# Patient Record
Sex: Male | Born: 1935 | Race: White | Hispanic: No | Marital: Married | Smoking: Current every day smoker
Health system: Southern US, Community
[De-identification: ages and names within clinical notes are randomized; demographics above are authoritative.]

## PROBLEM LIST (undated history)

## (undated) DIAGNOSIS — I10 Essential (primary) hypertension: Secondary | ICD-10-CM

## (undated) DIAGNOSIS — R001 Bradycardia, unspecified: Secondary | ICD-10-CM

## (undated) DIAGNOSIS — Z8669 Personal history of other diseases of the nervous system and sense organs: Secondary | ICD-10-CM

## (undated) DIAGNOSIS — I619 Nontraumatic intracerebral hemorrhage, unspecified: Secondary | ICD-10-CM

## (undated) DIAGNOSIS — F0390 Unspecified dementia without behavioral disturbance: Secondary | ICD-10-CM

## (undated) DIAGNOSIS — R55 Syncope and collapse: Secondary | ICD-10-CM

## (undated) DIAGNOSIS — I671 Cerebral aneurysm, nonruptured: Secondary | ICD-10-CM

## (undated) HISTORY — DX: Syncope and collapse: R55

## (undated) HISTORY — DX: Personal history of other diseases of the nervous system and sense organs: Z86.69

## (undated) HISTORY — DX: Cerebral aneurysm, nonruptured: I67.1

## (undated) HISTORY — PX: CEREBRAL ANEURYSM REPAIR: SHX164

## (undated) HISTORY — PX: COLONOSCOPY: SHX174

## (undated) HISTORY — PX: VENTRICULOPERITONEAL SHUNT: SHX204

## (undated) HISTORY — DX: Essential (primary) hypertension: I10

## (undated) HISTORY — DX: Nontraumatic intracerebral hemorrhage, unspecified: I61.9

## (undated) HISTORY — DX: Bradycardia, unspecified: R00.1

---

## 1997-06-07 ENCOUNTER — Ambulatory Visit (HOSPITAL_COMMUNITY): Admission: RE | Admit: 1997-06-07 | Discharge: 1997-06-07 | Payer: Self-pay | Admitting: *Deleted

## 1998-07-07 ENCOUNTER — Ambulatory Visit (HOSPITAL_COMMUNITY): Admission: RE | Admit: 1998-07-07 | Discharge: 1998-07-07 | Payer: Self-pay | Admitting: *Deleted

## 1999-09-20 ENCOUNTER — Emergency Department (HOSPITAL_COMMUNITY): Admission: EM | Admit: 1999-09-20 | Discharge: 1999-09-20 | Payer: Self-pay | Admitting: Emergency Medicine

## 1999-11-09 ENCOUNTER — Ambulatory Visit (HOSPITAL_COMMUNITY): Admission: RE | Admit: 1999-11-09 | Discharge: 1999-11-09 | Payer: Self-pay | Admitting: *Deleted

## 2000-09-17 ENCOUNTER — Encounter: Payer: Self-pay | Admitting: Emergency Medicine

## 2000-09-17 ENCOUNTER — Emergency Department (HOSPITAL_COMMUNITY): Admission: EM | Admit: 2000-09-17 | Discharge: 2000-09-17 | Payer: Self-pay | Admitting: Emergency Medicine

## 2000-11-16 ENCOUNTER — Ambulatory Visit (HOSPITAL_COMMUNITY): Admission: RE | Admit: 2000-11-16 | Discharge: 2000-11-16 | Payer: Self-pay | Admitting: Internal Medicine

## 2000-11-16 ENCOUNTER — Encounter: Payer: Self-pay | Admitting: Internal Medicine

## 2001-01-12 ENCOUNTER — Ambulatory Visit (HOSPITAL_COMMUNITY): Admission: RE | Admit: 2001-01-12 | Discharge: 2001-01-12 | Payer: Self-pay | Admitting: Gastroenterology

## 2001-01-12 ENCOUNTER — Encounter (INDEPENDENT_AMBULATORY_CARE_PROVIDER_SITE_OTHER): Payer: Self-pay | Admitting: Specialist

## 2001-12-19 ENCOUNTER — Encounter: Payer: Self-pay | Admitting: Internal Medicine

## 2001-12-19 ENCOUNTER — Ambulatory Visit (HOSPITAL_COMMUNITY): Admission: RE | Admit: 2001-12-19 | Discharge: 2001-12-19 | Payer: Self-pay | Admitting: Internal Medicine

## 2002-02-02 ENCOUNTER — Emergency Department (HOSPITAL_COMMUNITY): Admission: EM | Admit: 2002-02-02 | Discharge: 2002-02-03 | Payer: Self-pay | Admitting: Emergency Medicine

## 2002-12-04 ENCOUNTER — Emergency Department (HOSPITAL_COMMUNITY): Admission: AD | Admit: 2002-12-04 | Discharge: 2002-12-04 | Payer: Self-pay | Admitting: Emergency Medicine

## 2003-01-17 ENCOUNTER — Ambulatory Visit (HOSPITAL_COMMUNITY): Admission: RE | Admit: 2003-01-17 | Discharge: 2003-01-17 | Payer: Self-pay | Admitting: Cardiology

## 2003-09-15 ENCOUNTER — Inpatient Hospital Stay (HOSPITAL_COMMUNITY): Admission: EM | Admit: 2003-09-15 | Discharge: 2003-09-17 | Payer: Self-pay | Admitting: Emergency Medicine

## 2005-04-05 ENCOUNTER — Ambulatory Visit (HOSPITAL_COMMUNITY): Admission: RE | Admit: 2005-04-05 | Discharge: 2005-04-05 | Payer: Self-pay | Admitting: Internal Medicine

## 2005-05-10 ENCOUNTER — Ambulatory Visit: Payer: Self-pay | Admitting: Internal Medicine

## 2005-05-26 ENCOUNTER — Encounter (INDEPENDENT_AMBULATORY_CARE_PROVIDER_SITE_OTHER): Payer: Self-pay | Admitting: Specialist

## 2005-05-26 ENCOUNTER — Ambulatory Visit (HOSPITAL_COMMUNITY): Admission: RE | Admit: 2005-05-26 | Discharge: 2005-05-26 | Payer: Self-pay | Admitting: Gastroenterology

## 2006-04-13 ENCOUNTER — Ambulatory Visit (HOSPITAL_COMMUNITY): Admission: RE | Admit: 2006-04-13 | Discharge: 2006-04-13 | Payer: Self-pay | Admitting: Internal Medicine

## 2006-05-13 ENCOUNTER — Ambulatory Visit: Payer: Self-pay | Admitting: Internal Medicine

## 2006-12-23 ENCOUNTER — Ambulatory Visit: Payer: Self-pay | Admitting: Cardiology

## 2006-12-23 ENCOUNTER — Emergency Department (HOSPITAL_COMMUNITY): Admission: EM | Admit: 2006-12-23 | Discharge: 2006-12-23 | Payer: Self-pay | Admitting: Emergency Medicine

## 2007-01-03 ENCOUNTER — Ambulatory Visit: Payer: Self-pay | Admitting: Internal Medicine

## 2007-01-03 ENCOUNTER — Ambulatory Visit: Payer: Self-pay

## 2007-01-03 ENCOUNTER — Encounter: Payer: Self-pay | Admitting: Internal Medicine

## 2007-01-03 LAB — CONVERTED CEMR LAB
BUN: 7 mg/dL (ref 6–23)
CO2: 28 meq/L (ref 19–32)
Calcium: 8.2 mg/dL — ABNORMAL LOW (ref 8.4–10.5)
Chloride: 95 meq/L — ABNORMAL LOW (ref 96–112)
Creatinine, Ser: 0.8 mg/dL (ref 0.4–1.5)
GFR calc Af Amer: 123 mL/min
GFR calc non Af Amer: 102 mL/min
Glucose, Bld: 117 mg/dL — ABNORMAL HIGH (ref 70–99)
Potassium: 3.6 meq/L (ref 3.5–5.1)
Sodium: 130 meq/L — ABNORMAL LOW (ref 135–145)

## 2007-02-21 ENCOUNTER — Ambulatory Visit: Payer: Self-pay | Admitting: Internal Medicine

## 2007-02-24 ENCOUNTER — Emergency Department (HOSPITAL_COMMUNITY): Admission: EM | Admit: 2007-02-24 | Discharge: 2007-02-24 | Payer: Self-pay | Admitting: Emergency Medicine

## 2007-05-02 ENCOUNTER — Ambulatory Visit (HOSPITAL_COMMUNITY): Admission: RE | Admit: 2007-05-02 | Discharge: 2007-05-02 | Payer: Self-pay | Admitting: Internal Medicine

## 2007-05-22 ENCOUNTER — Ambulatory Visit: Payer: Self-pay | Admitting: Internal Medicine

## 2008-04-17 DIAGNOSIS — I10 Essential (primary) hypertension: Secondary | ICD-10-CM | POA: Insufficient documentation

## 2008-04-17 DIAGNOSIS — Z8669 Personal history of other diseases of the nervous system and sense organs: Secondary | ICD-10-CM

## 2008-04-17 DIAGNOSIS — R55 Syncope and collapse: Secondary | ICD-10-CM

## 2008-04-17 DIAGNOSIS — I619 Nontraumatic intracerebral hemorrhage, unspecified: Secondary | ICD-10-CM | POA: Insufficient documentation

## 2008-04-17 DIAGNOSIS — I671 Cerebral aneurysm, nonruptured: Secondary | ICD-10-CM

## 2008-04-17 DIAGNOSIS — R001 Bradycardia, unspecified: Secondary | ICD-10-CM

## 2008-04-18 ENCOUNTER — Ambulatory Visit: Payer: Self-pay | Admitting: Internal Medicine

## 2008-04-18 ENCOUNTER — Encounter: Payer: Self-pay | Admitting: Internal Medicine

## 2008-05-17 ENCOUNTER — Ambulatory Visit (HOSPITAL_COMMUNITY): Admission: RE | Admit: 2008-05-17 | Discharge: 2008-05-17 | Payer: Self-pay | Admitting: Internal Medicine

## 2009-05-29 ENCOUNTER — Ambulatory Visit: Payer: Self-pay | Admitting: Internal Medicine

## 2009-12-31 ENCOUNTER — Encounter
Admission: RE | Admit: 2009-12-31 | Discharge: 2009-12-31 | Payer: Self-pay | Source: Home / Self Care | Attending: Diagnostic Neuroimaging | Admitting: Diagnostic Neuroimaging

## 2010-01-15 ENCOUNTER — Encounter: Payer: Self-pay | Admitting: Internal Medicine

## 2010-01-15 ENCOUNTER — Ambulatory Visit
Admission: RE | Admit: 2010-01-15 | Discharge: 2010-01-15 | Payer: Self-pay | Source: Home / Self Care | Attending: Internal Medicine | Admitting: Internal Medicine

## 2010-02-05 NOTE — Assessment & Plan Note (Signed)
Summary: 1 yr fu   Visit Type:  Follow-up Primary Provider:  Marisue Brooklyn, MD   History of Present Illness: Ian Sellers returns today for followup.  He is a pleasant 76 yo man with a h/o defecation syncope.  He denies constipation recently.  He has had one or two episodes of syncope since I last saw him a year ago.  He did not hurt himself.  He has not had syncope at other times.  He has continued to have HTN.  Current Medications (verified): 1)  Colace 100 Mg Caps (Docusate Sodium) .... As Needed 2)  Enalapril Maleate 20 Mg Tabs (Enalapril Maleate) .... 2 By Mouth Once Daily 3)  Furosemide 20 Mg Tabs (Furosemide) .... Take One Tablet By Mouth Daily. 4)  Amlodipine Besylate 10 Mg Tabs (Amlodipine Besylate) .... Take One Tablet By Mouth Daily 5)  Clonazepam 1 Mg Tabs (Clonazepam) .... As Needed 6)  Doxazosin Mesylate 4 Mg Tabs (Doxazosin Mesylate) .Marland Kitchen.. 1 By Mouth Once Daily 7)  Carbatrol 300 Mg Xr12h-Cap (Carbamazepine) .... 2 By Mouth Two Times A Soth 8)  Calcium 500 + D 500-125 Mg-Unit Tabs (Calcium Carbonate-Vitamin D) .Marland Kitchen.. 1 By Mouth Once Daily 9)  Multivitamins   Tabs (Multiple Vitamin) .... Take One Tablet By Mouth Once Daily. 10)  Vitamin D3 1000 Unit Tabs (Cholecalciferol) .... Two Times A Rosero  Allergies (verified): 1)  ! Sulfa  Past History:  Past Medical History: Last updated: 04/17/2008 INTRACEREBRAL HEMORRHAGE (ICD-431) BRADYCARDIA (ICD-427.89) SEIZURES, HX OF (ICD-V12.49) SYNCOPE (ICD-780.2) CEREBRAL ANEURYSM (ICD-437.3) HYPERTENSION (ICD-401.9)  Past Surgical History: Last updated: 04/17/2008 Colonoscopy with hot biopsy.  Colonoscopy with polypectomy. Cerebral aneurysm, status post surgery.   Ventriculoperitoneal shunt.  Review of Systems       The patient complains of syncope.  The patient denies chest pain, dyspnea on exertion, and peripheral edema.    Vital Signs:  Patient profile:   76 year old male Height:      67 inches Weight:      145 pounds BMI:      22.79 Pulse rate:   60 / minute BP sitting:   158 / 60  (left arm)  Vitals Entered By: Laurance Flatten CMA (May 29, 2009 3:46 PM)  Physical Exam  General:  Well developed, well nourished, in no acute distress. Head:  normocephalic and atraumatic Eyes:  PERRLA/EOM intact; conjunctiva and lids normal. Mouth:  Teeth, gums and palate normal. Oral mucosa normal. Neck:  Neck supple, no JVD. No masses, thyromegaly or abnormal cervical nodes. Lungs:  Clear bilaterally to auscultation with no wheezes, rales, or rhonchi. Heart:  RRR with normal S1 and a split S2.  PMI is enlarged and laterally displaced. Abdomen:  Bowel sounds positive; abdomen soft and non-tender without masses, organomegaly, or hernias noted. No hepatosplenomegaly. Msk:  Back normal, normal gait. Muscle strength and tone normal. Pulses:  pulses normal in all 4 extremities Extremities:  No clubbing or cyanosis. Neurologic:  Alert and oriented x 3.   EKG  Procedure date:  05/29/2009  Findings:      Normal sinus rhythm with rate of:  60.  Impression & Recommendations:  Problem # 1:  SYNCOPE (ICD-780.2) His symptoms have been for the most part well controlled.  He will continue his current meds.  I have recommended he increase the fiber in his diet.  He will continue his colace. His updated medication list for this problem includes:    Enalapril Maleate 20 Mg Tabs (Enalapril maleate) .Marland Kitchen... 2 by  mouth once daily    Amlodipine Besylate 10 Mg Tabs (Amlodipine besylate) .Marland Kitchen... Take one tablet by mouth daily  Problem # 2:  HYPERTENSION (ICD-401.9) His blood pressure is elevated today.  However, with his h/o syncope, I have recommended that we continue his current meds and allow him to live with a bit higher pressure in hopes of preventing his syncope. The following medications were removed from the medication list:    Cardura 8 Mg Tabs (Doxazosin mesylate) .Marland Kitchen... 1 by mouth once daily His updated medication list for this problem  includes:    Enalapril Maleate 20 Mg Tabs (Enalapril maleate) .Marland Kitchen... 2 by mouth once daily    Furosemide 20 Mg Tabs (Furosemide) .Marland Kitchen... Take one tablet by mouth daily.    Amlodipine Besylate 10 Mg Tabs (Amlodipine besylate) .Marland Kitchen... Take one tablet by mouth daily    Doxazosin Mesylate 4 Mg Tabs (Doxazosin mesylate) .Marland Kitchen... 1 by mouth once daily  Patient Instructions: 1)  Your physician recommends that you schedule a follow-up appointment in: 12 months with Dr Ladona Ridgel

## 2010-02-05 NOTE — Assessment & Plan Note (Signed)
Summary: yearly/sl   Visit Type:  Follow-up Primary Provider:  Marisue Brooklyn, MD   History of Present Illness: Ian Sellers returns today for followup.  He has a h/o autonomic dysfunction and defecation syncope. I last saw him several months ago and he was doing well with only rare episodes of recurrent symptoms while on colace and a high fiber diet. His wife notes that his bowels are a bit loose but no diarrhea. He denies c/p, sob, or peripheral edema. He fell once while walking. He does not check his blood pressure at home.  Current Medications (verified): 1)  Colace 100 Mg Caps (Docusate Sodium) .... As Needed 2)  Enalapril Maleate 20 Mg Tabs (Enalapril Maleate) .... 2 By Mouth Once Daily 3)  Amlodipine Besylate 10 Mg Tabs (Amlodipine Besylate) .... Take One Tablet By Mouth Daily 4)  Clonazepam 1 Mg Tabs (Clonazepam) .... As Needed 5)  Doxazosin Mesylate 4 Mg Tabs (Doxazosin Mesylate) .Marland Kitchen.. 1 By Mouth Once Daily 6)  Carbatrol 300 Mg Xr12h-Cap (Carbamazepine) .... 2 By Mouth Two Times A Schnee 7)  Calcium 500 + D 500-125 Mg-Unit Tabs (Calcium Carbonate-Vitamin D) .Marland Kitchen.. 1 By Mouth Once Daily 8)  Multivitamins   Tabs (Multiple Vitamin) .... Take One Tablet By Mouth Once Daily. 9)  Vitamin D3 1000 Unit Tabs (Cholecalciferol) .... 6 Tads Daily  Allergies: 1)  ! Sulfa  Past History:  Past Medical History: Last updated: 04/17/2008 INTRACEREBRAL HEMORRHAGE (ICD-431) BRADYCARDIA (ICD-427.89) SEIZURES, HX OF (ICD-V12.49) SYNCOPE (ICD-780.2) CEREBRAL ANEURYSM (ICD-437.3) HYPERTENSION (ICD-401.9)  Past Surgical History: Last updated: 04/17/2008 Colonoscopy with hot biopsy.  Colonoscopy with polypectomy. Cerebral aneurysm, status post surgery.   Ventriculoperitoneal shunt.  Vital Signs:  Patient profile:   76 year old male Height:      67 inches Weight:      156 pounds BMI:     24.52 Pulse rate:   72 / minute BP sitting:   182 / 72  (left arm)  Vitals Entered By: Ian Sellers CMA  (January 15, 2010 9:19 AM)  Physical Exam  General:  Well developed, well nourished, in no acute distress. Head:  normocephalic and atraumatic Eyes:  PERRLA/EOM intact; conjunctiva and lids normal. Neck:  Neck supple, no JVD. No masses, thyromegaly or abnormal cervical nodes. Lungs:  Clear bilaterally to auscultation with no wheezes, rales, or rhonchi. Heart:  RRR with normal S1 and a split S2.  PMI is enlarged and laterally displaced. Abdomen:  Bowel sounds positive; abdomen soft and non-tender without masses, organomegaly, or hernias noted. No hepatosplenomegaly. Msk:  Back normal, normal gait. Muscle strength and tone normal. Pulses:  pulses normal in all 4 extremities Extremities:  No clubbing or cyanosis. Neurologic:  Alert and oriented x 3.   EKG  Procedure date:  01/15/2010  Findings:      Normal sinus rhythm with rate of:  First degree AV-Block noted.  Left axis deviation.    Impression & Recommendations:  Problem # 1:  SYNCOPE (ICD-780.2) His symptoms are quiet. Will follow. His updated medication list for this problem includes:    Enalapril Maleate 20 Mg Tabs (Enalapril maleate) .Marland Kitchen... 2 by mouth once daily    Amlodipine Besylate 10 Mg Tabs (Amlodipine besylate) .Marland Kitchen... Take one tablet by mouth daily  Problem # 2:  HYPERTENSION (ICD-401.9) His blood pressure is high. He is not checking his blood pressure at home.  His wife states that it is typically lower. Will ask him to keep a log and let us know if his  pressure remains high. The following medications were removed from the medication list:    Furosemide 20 Mg Tabs (Furosemide) .Marland Kitchen... Take one tablet by mouth daily. His updated medication list for this problem includes:    Enalapril Maleate 20 Mg Tabs (Enalapril maleate) .Marland Kitchen... 2 by mouth once daily    Amlodipine Besylate 10 Mg Tabs (Amlodipine besylate) .Marland Kitchen... Take one tablet by mouth daily    Doxazosin Mesylate 4 Mg Tabs (Doxazosin mesylate) .Marland Kitchen... 1 by mouth once  daily  Other Orders: EKG w/ Interpretation (93000)  Patient Instructions: 1)  Your physician recommends that you continue on your current medications as directed. Please refer to the Current Medication list given to you today. 2)  Your physician wants you to follow-up in: 12 months with Dr Court Joy will receive a reminder letter in the mail two months in advance. If you don't receive a letter, please call our office to schedule the follow-up appointment.

## 2010-04-01 ENCOUNTER — Other Ambulatory Visit (HOSPITAL_COMMUNITY): Payer: Self-pay | Admitting: Internal Medicine

## 2010-04-01 ENCOUNTER — Ambulatory Visit (HOSPITAL_COMMUNITY)
Admission: RE | Admit: 2010-04-01 | Discharge: 2010-04-01 | Disposition: A | Discharge status: Home / Self Care | Payer: Medicare Other | Source: Ambulatory Visit | Attending: Internal Medicine | Admitting: Internal Medicine

## 2010-04-01 DIAGNOSIS — M25519 Pain in unspecified shoulder: Secondary | ICD-10-CM

## 2010-04-01 DIAGNOSIS — R079 Chest pain, unspecified: Secondary | ICD-10-CM | POA: Insufficient documentation

## 2010-04-01 DIAGNOSIS — I10 Essential (primary) hypertension: Secondary | ICD-10-CM

## 2010-04-01 DIAGNOSIS — F17200 Nicotine dependence, unspecified, uncomplicated: Secondary | ICD-10-CM | POA: Insufficient documentation

## 2010-04-01 DIAGNOSIS — M19019 Primary osteoarthritis, unspecified shoulder: Secondary | ICD-10-CM | POA: Insufficient documentation

## 2010-04-01 DIAGNOSIS — R05 Cough: Secondary | ICD-10-CM | POA: Insufficient documentation

## 2010-04-01 DIAGNOSIS — R059 Cough, unspecified: Secondary | ICD-10-CM | POA: Insufficient documentation

## 2010-05-19 NOTE — Assessment & Plan Note (Signed)
[facility]                         ELECTROPHYSIOLOGY OFFICE NOTE   ARRICK, DUTTON                          MRN:          [id number]  DATE:05/22/2007                            DOB:          [birth year]    Mr. Ian Sellers returns today for follow-up.  He is a very pleasant male with  neurally mediated syncope, mostly micturition and defecation syncope,  who returns today for follow-up.  He notes that he has had some fatigue  and weakness and shortness of breath over the last several months and  one frank episode of syncope associated with loss of bowel and bladder  continence.  The patient notes that prior to his episode of passing out  that he did not feel the urge to urinate or defecate but apparently did  so after he passed out.  He was quite diaphoretic afterwards.  He  eventually felt better.  The patient also suffers with hypertension  which has been difficult control.   MEDICATIONS:  Include:  1. Enalapril 40 a [place].  2. Doxazosin 8 mg half tablet twice daily.  3. Clonazepam 0.5 every night.  4. Norvasc 10 a [place]ister.  5. Furosemide 20 a Delancy p.r.n.  6. Colace.   On physical exam, he is a pleasant elderly man in no acute distress.  Blood pressure today was 162/82, pulse 60 and regular, respirations were  18.  Weight was 151 pounds.  NECK:  Revealed no jugular venous distention.  No thyromegaly.  LUNGS:  Clear bilaterally to auscultation.  No wheezes, rales or rhonchi  were present.  CARDIOVASCULAR EXAM:  Revealed a regular rhythm, normal  S1-S2.  There were no murmurs, rubs, or gallops that I can appreciate.  ABDOMINAL EXAM:  Soft, nontender, nondistended.  There was no  organomegaly.  EXTREMITIES:  Demonstrated no cyanosis, clubbing or edema.   EKG demonstrates sinus bradycardia with questionable septal infarct.   IMPRESSION:  1. Recurrent episodes of syncope.  2. Hypertension.   DISCUSSION:  The patient has a difficult combination of symptoms.   He  has an indication for implantable loop recorder.  I suspect he is having  a component bradycardia with these episodes, but I have never been able  to document.  As per our last visit, I have recommended proceeding with  loop recorder implantation to further assess his episodes of syncope.  He is considering his options and will call us if he would like to  schedule loop recorder insertion.    Doylene Canning. Ladona Ridgel, MD  Electronically Signed   GWT/MedQ  DD: 05/22/2007  DT: 05/22/2007  Job #: [id number]

## 2010-05-19 NOTE — Consult Note (Signed)
NAME:  Ian Sellers, Ian Sellers                   ACCOUNT NO.:  [id number]   MEDICAL RECORD NO.:  [id number]          PATIENT TYPE:  EMS   LOCATION:  MAJO                         FACILITY:  [facility]   PHYSICIAN:  Everardo Beals. Juanda Chance, MD, FACCDATE OF BIRTH:  1934-06-26   DATE OF CONSULTATION:  12/23/2006  DATE OF DISCHARGE:                                 CONSULTATION   PRIMARY CARE PHYSICIAN:  Dr. Marisue Brooklyn.   CARDIOLOGIST:  Dr. Willa Rough and Dr. Lewayne Bunting.   CHIEF COMPLAINT:  Blackout spell.   CLINICAL HISTORY:  Mr. Mroczka is 76 years old and has a history of prior  syncopal spells.  Today while he was on the commode and straining, he  developed diaphoresis and then blacked out.  He did not fall to the  floor but slumped on the commode and apparently completely passed out.  His wife saw him and he had some noisy breathing and then responded.  She called 911 and EMS brought him to the emergency room.  He had no  associated shortness of breath, chest pain or palpitations.   He has had a number of similar episodes in the past and has been  followed by Dr. Ladona Ridgel for this.  The last episode he had was about 3 or  4 months ago.  He had normal LV function by echo 4 years ago.  We have  not been able to find any record of any stress test.  He and his family  says he has had an event monitor and bradycardia sometime in the past.   PAST MEDICAL HISTORY:  Significant for hypertension and a history of  seizure disorder.  He also has a VP shunt presumably for some type of  hydrocephalus.   CURRENT MEDICATIONS:  1. Carbatrol 500 mg q.i.d.  2. Clonazepam 1 mg q.h.s.  3. Doxazosin 4 mg q.h.s.  4. Enalapril 20 mg daily.  5. Norvasc 10 mg daily.   SOCIAL HISTORY:  He lives with his wife.  He is a smoker.   FAMILY HISTORY:  Noncontributory.   REVIEW OF SYSTEMS:  Negative for chest pain, shortness of breath and  palpitations.  He does have some arthritic symptoms but review of  systems is otherwise  negative.   PHYSICAL EXAMINATION:  The blood pressure 159/78, pulse 58 and regular.  There is no vein distention. The carotid pulses were full without  bruits.  CHEST:  Clear without rales or rhonchi.  HEART:  Rhythm was regular. I could hear no murmurs, no gallops.  ABDOMEN:  Soft with normal bowel sounds.  There was no  hepatosplenomegaly. Peripheral pulses were full and there was no  peripheral edema.  MUSCULOSKELETAL:  Showed no deformities.  SKIN:  Warm and dry.  NEUROLOGIC:  showed no focal neurological signs.   Electrocardiogram showed sinus bradycardia with first degree AV block.  There was an intraventricular conduction delay and poor R wave  progression.   IMPRESSION:  1. Recurrent syncope probably neurocardiogenic syncope.  2. Hypertension.  3. History of seizure disorder.  4. Status post VP shunt presumably  for hydrocephalous.   RECOMMENDATIONS:  Will plan to check laboratory studies to make sure his  cardiac markers are negative and electrolytes are in balance and a CBC  is normal.  If these studies are all negative then I think he should be  able to go home today.  We will arrange an outpatient echocardiogram  since it has been some time since he had this done and also an  outpatient event monitor.  Will arrange follow up with Dr. Ladona Ridgel.      Bruce Elvera Lennox Juanda Chance, MD, [facility]  Electronically Signed     BRB/MEDQ  D:  12/23/2006  T:  12/24/2006  Job:  [id number]

## 2010-05-19 NOTE — Assessment & Plan Note (Signed)
 [facility]                         ELECTROPHYSIOLOGY OFFICE NOTE   Ian Sellers, Ian Sellers                          MRN:          [id number]  DATE:02/21/2007                            DOB:          02-07-1934    Ian Sellers returns today for follow-up.  He is a very pleasant 76 year old  male with a history of unexplained syncope, question defecation syncope,  who returns today for follow-up.  The patient notes he has had syncope  at least twice year for the last several years, never injured himself  severely.  He is here today for additional evaluation.   MEDICATIONS:  1. Enalapril 40 mg a Chakraborty.  2. Calcium supplements.  3. Carbatrol 300 __________ 2 tablets twice daily.  4. Doxazosin 8 mg half-tablet twice daily.  5. Norvasc 10 mg daily.  6. Multiple vitamins.   FAMILY HISTORY:  Noncontributory.   SOCIAL HISTORY:  The patient has tobacco abuse history, 1 pack a [place] for  30 years.  No alcohol abuse is noted.   EXAM:  He is a pleasant, well-appearing man in no distress.  Blood pressure today was 172/86, the pulse 63 and regular, the  respirations were 18.  Weight 152 pounds.  NECK:  No jugular distention.  LUNGS:  Clear bilaterally to auscultation.  No wheezes, rales or rhonchi  present.  CARDIOVASCULAR:  Regular rate and rhythm.  Normal S1 and S2.  There were  no murmurs or gallops.  The PMI was not enlarged or laterally displaced.  There was no increased work of breathing on the lung exam.  ABDOMEN:  Soft, nontender, distended, no organomegaly.  EXTREMITIES:  No cyanosis, clubbing or edema.  The pulses were 2+ and  symmetric.  NEUROLOGIC:  Alert and oriented x3, cranial nerves intact.   EKG demonstrates sinus bradycardia with first-degree AV block.   IMPRESSION:  1. Unexplained syncope.  2. Hypertension.   DISCUSSION:  I have recommended that the patient undergo insertion of  implantable loop recorder.  He is considering his options.  He will  call  us if he would like to proceed with this.     Doylene Canning. Ladona Ridgel, MD  Electronically Signed    GWT/MedQ  DD: 02/21/2007  DT: 02/22/2007  Job #: [id number]

## 2010-05-19 NOTE — Assessment & Plan Note (Signed)
[facility]                         ELECTROPHYSIOLOGY OFFICE NOTE   KAISER, BELLUOMINI                          MRN:          [id number]  DATE:05/13/2006                            DOB:          04-Jan-1935    Mr. Nelligan returns today for followup. He is a very pleasant man with a  history of syncope with multiple potential mechanisms. He has a history  of bradycardia on cardiac monitoring which was asymptomatic. He has a  history of micturition as well as defecation syncope and the patient  notes that in the last year he has had one episode. It occurred several  weeks ago when he was [place] the [place]. It had been hot  that Ellis, he had been outside most of it and became somewhat dehydration  and suddenly passed out. He was only out for a few seconds by his wife's  report and when he awoke he was somewhat diaphoretic and nauseated. He  was treated with fluids and was placed in the shade but did not  otherwise have to seek medical attention. He returns today for followup  and has no recurrent spells since then. He has otherwise been stable. He  denies chest pain or shortness of breath. He has very minimal peripheral  edema which is dependent and better after sleeping at night.   PHYSICAL EXAMINATION:  GENERAL:  He is a pleasant, 76 year old man in no  acute distress.  VITAL SIGNS:  The blood pressure today was 144/72, pulse was 63 and  regular, respirations were 18 and the weight was 151 pounds.  NECK:  Revealed no jugular venous distention.  LUNGS:  Clear bilaterally to auscultation. There were no wheezes, rales  or rhonchi.  CARDIOVASCULAR:  Regular rate and rhythm with normal S1 and S2.  EXTREMITIES:  Demonstrated no edema.   EKG demonstrated sinus rhythm with first degree AV block and nonspecific  intraventricular conduction delay.   IMPRESSION:  1. Recurrent syncope likely neurally mediated versus autonomic      dysfunction.  2.  History of sinus bradycardia.  3. History of encephalomalacia.   DISCUSSION:  Overall Mr. Kimoto is stable. He is tolerating all of his  medications very nicely. These include aspirin, enalapril, multivitamin,  doxazosin, clonazepam, Norvasc and p.r.n. Lasix. I will plan to see the  patient back in the office in one year. We talked about goals and  methods to preventing additional syncope. If he has recurrent syncope  then he is instructed to call us for additional evaluation.    Doylene Canning. Ladona Ridgel, MD  Electronically Signed   GWT/MedQ  DD: 05/13/2006  DT: 05/14/2006  Job #: [id number]   cc:   Lovenia Kim, D.O.

## 2010-05-22 NOTE — Op Note (Signed)
NAME:  Ian Sellers, Ian Sellers                   ACCOUNT NO.:  [id number]   MEDICAL RECORD NO.:  [id number]          PATIENT TYPE:  AMB   LOCATION:  ENDO                         FACILITY:  [facility]   PHYSICIAN:  Petra Kuba, M.D.    DATE OF BIRTH:  10-22-1934   DATE OF PROCEDURE:  05/26/2005  DATE OF DISCHARGE:                                 OPERATIVE REPORT   PROCEDURE:  Colonoscopy with hot biopsy.   INDICATIONS FOR PROCEDURE:  Patient with a history of colon polyps due for  repeat screening.  Consent was signed after risks, benefits, methods and  options were thoroughly discussed multiple times in the past.   MEDICINES USED:  Fentanyl 50, Versed 5.   PROCEDURE:  Rectal inspection was pertinent for small external hemorrhoids.  Digital exam was negative.  The video pediatric adjustable colonoscope was  inserted, easily advanced around the colon to the cecum.  This did not  require any abdominal pressure or any position changes. Other than left-  sided diverticula, no other abnormalities were seen.  The cecum was  identified by the appendiceal orifice and the ileocecal valve. The prep was  fairly adequate. With a moderate amount of washing and suctioning, adequate  visualization was obtained. On slow withdrawal through the colon, the cecum  and the ascending were normal. In the transverse, three tiny to small polyps  were seen and were all hot biopsied x1 or 2. The scope was further  withdrawn.  She did have some diverticula on the left side with some spasm  and wall edema and a few hyperplastic appearing sigmoid polyps were seen,  one was hot biopsied which was slightly larger than the others and a few  were cold biopsied and put in a second container. The scope was withdrawn  back to the rectum. Anorectal pull-through and retroflexion confirmed some  small hemorrhoids. The scope was straightened and readvanced a short ways up  the left side of the colon, air was suctioned, scope removed.  The  patient  tolerated the procedure well.  There was no obvious or immediate  complication.   ENDOSCOPIC DIAGNOSES:  1.  Internal and external small hemorrhoids.  2.  Left-sided diverticula, some spasm and edema.  3.  Multiple sigmoid hyperplastic appearing polyps, one hot biopsy, two cold      biopsied.  4.  Three tiny small transverse polyps hot biopsied and put in a separate      container.  5.  Otherwise within normal limits to the cecum.   PLAN:  Await pathology but if doing medically probably proceed with a repeat  check in 5 years. Happy to see back p.r.n. otherwise return care Dr.  Elisabeth Most for the customary health care screening and maintenance.           ______________________________  Petra Kuba, M.D.     MEM/MEDQ  D:  05/26/2005  T:  05/26/2005  Job:  [id number]

## 2010-05-22 NOTE — Procedures (Signed)
[facility]  Patient:    Ian Sellers, Ian Sellers Visit Number: [id number] MRN: [id number]          Service Type: END Location: ENDO Attending Physician:  Nelda Marseille Dictated by:   Petra Kuba, M.D. Proc. Date: 01/12/01 Admit Date:  01/12/2001   CC:         Marinus Maw, M.D.   Procedure Report  PROCEDURE:  Colonoscopy with polypectomy.  INDICATIONS FOR PROCEDURE:  A patient with guaiac positivity.  Consent was signed after risks, benefits, methods, and options were thoroughly discussed in the office.  MEDICINES USED:  Demerol 60, Versed 6.  DESCRIPTION OF PROCEDURE:  Rectal inspection was pertinent for external hemorrhoids. Digital exam was negative. The video colonoscope was inserted and easily advanced around the colon to the cecum. On insertion, some left sided diverticula with a mild amount of inflammation was seen but no other abnormalities. The cecum was identified by the appendiceal orifice and the ileocecal valve. In fact, the scope was inserted a short ways into the terminal ileum which was normal. Photo documentation was obtained. The scope was slowly withdrawn. The prep was adequate. There was some liquid stool that required washing and suctioning. The TI and cecum were normal. In the ascending, hepatic flexure, and proximal transverse, three tiny to small polyps were seen and were all hot biopsied x1 or 2 and put in the same container. No other polypoid lesions were seen as we slowly withdrew back to the rectum. The left sided diverticula and mild inflammation was confirmed but no signs of diverticulitis. Once back in the rectum, the scope was retroflexed pertinent for some internal hemorrhoids and a little bit of scope trauma. The area of trauma was washed and watched. There was no active bleeding seen and there was no underlying lesion. Anal rectal pull-through confirmed the hemorrhoids, ruled out any other lesions. The scope  was reinserted a short ways up the left side of the colon, air and water were suctioned, scope removed. The patient tolerated the procedure well and there was no obvious or immediate complication.  ENDOSCOPIC DIAGNOSIS:  1. Internal/external hemorrhoids.  2. Left sided diverticula with minimal inflammation.  3. Three transverse and ascending tiny to small polyps hot biopsied.  4. Otherwise within normal limits to the terminal ileum.  PLAN:  Await pathology to determine future colonic screening. Follow-up p.r.n. or in two to three months to recheck guaiac, ______ symptoms, possibly CBC and decide if any further workup plans are needed like a one time endoscopy or upper GI small bowel follow-through. Dictated by:   Petra Kuba, M.D. Attending Physician:  Nelda Marseille DD:  01/12/01 TD:  01/13/01 Job: [id number]

## 2010-05-22 NOTE — H&P (Signed)
NAME:  Ian Sellers, Ian Sellers                             ACCOUNT NO.:  [id number]   MEDICAL RECORD NO.:  [id number]                   PATIENT TYPE:  EMS   LOCATION:  MAJO                                 FACILITY:  [facility]   PHYSICIAN:  Mobolaji B. Corky Downs, M.D.            DATE OF BIRTH:  [birth year]   DATE OF ADMISSION:  09/15/2003  DATE OF DISCHARGE:                                HISTORY & PHYSICAL   PRIMARY CARE PHYSICIAN:  Dr. Elisabeth Most.   CHIEF COMPLAINT:  Passing out in church this afternoon.   HISTORY OF PRESENTING COMPLAINT:  Ian Sellers is a 76 year old white male with  significant history of seizures, recurrent syncope in the past, and cerebral  aneurysm, status post ventriculoperitoneal shunt. The patient was sitting  and praying in church this morning when he suddenly felt hot all over his  body. He became diaphoretic and subsequently passed out for about two  minutes. His wife was right beside him. She noted that his shirt was wet and  she tried to wake him up. He did come around after about two minutes and he  was a little bit confused thereafter, but he soon recognized around people  around him. He was subsequently brought to the emergency department by EMS.  There was no associated foaming from the mouth, tongue biting, or bleeding  from the mouth. The patient's wife denies any seizure activity. Prior to  sitting down, praying, the patient was standing up, singing.   He has had several episodes of syncope in the past and he has been followed  up by Dr. Myrtis Ser. All tests, according to the patient's wife have been  negative so far.   The patient is also under the care of Dr. Thad Ranger for his seizures. His  last seizure was several years ago and he is compliant with his medications.   REVIEW OF SYSTEMS:  Negative for chest pain, shortness of breath,  palpitations. No cough, nausea, vomiting, or abdominal pain. No diarrhea or  constipation. No history of melenic stools. No urinary  symptoms. No joint  pains. No headaches. No focal weakness.   PAST MEDICAL HISTORY:  1.  Hypertension.  2.  Cerebral aneurysm, status post surgery.  3.  Ventriculoperitoneal shunt.  4.  Recurrent syncope.  5.  Seizures.   ALLERGIES:  He is allergic to SULFA. He cannot recall what type of reaction  he got many years ago.   FAMILY HISTORY:  The patient is married. He lives with his wife.   SOCIAL HISTORY:  He denies alcohol use and he is independent of activities  of daily living.   PHYSICAL EXAMINATION:  VITAL SIGNS: On arrival in the emergency department  blood pressure 143/80, pulse rate 62, respiratory rate 20, saturation 99%,  temperature 97.8. __________ on arrival by EMS was 106.  GENERAL: On examination he is alert and oriented to time, place, and person.  Evidence  of venectomy. Otherwise, there is no neck stiffness.  HEENT: Pupils equal, round, and reactive to light. Extraocular movements  intact. No carotid bruits. No cervical lymphadenopathy. No thyromegaly.  Mucous membranes moist.  LUNGS: Clear clinically to auscultation.  CVS: S1 and S2 regular. No gallop or murmur.  ABDOMEN: Obese, soft, nontender. No palpable organomegaly.  GROIN: No abnormality.  MUSCULOSKELETAL: No bruise or injury.  SKIN: No rash or petechia.  CNS: Cranial nerves intact.  Muscle power 5/5 bilaterally. Deep tendon  reflexes 1+ in both biceps and 2+ in both knees. Plantar reflexes are  flexor. Muscle tone is normal and equal bilaterally.   EKG shows first-degree AV block, sinus bradycardia of 57, and  interventricular conduction defects in leads III, normal axis.   Initial laboratory data reveals sodium of 129, potassium 4.6, chloride 99,  BUN 10, glucose 112.  Hematocrit 36, hemoglobin 12.2, white cell count 9.4,  platelet count 265,000, creatinine 0.8.   ASSESSMENT/PLAN:  Ian Sellers is a 76 year old white male with history of  hypertension, seizures, and recurrent syncope, now presenting with  another  episode of passing out, which was preceded by some prodromal symptoms.  However, the patient did have urinary incontinence, but no seizure activity  and no foaming from the mouth or tongue biting.  1.  Syncope, probably vasovagal. We need to rule out seizures. Will admit to      telemetry. Get serial cardiac enzymes. Obtain orthostatic blood      pressure. This was done and the patient not found to be orthostatic.      Will do a 2-D echocardiogram if not done recently. Will contact Dr.      Henrietta Hoover office to obtain records. Seizure precautions.  Intravenous      fluid. Normal saline at 100 cc per hour.  EEG. Awaiting list of      medications that the patient was using at home. Wife does not recall the      name at this point.  2.  Hypertension, uncontrolled. Will restart oral medications and if      necessary will titrate medications to control blood pressure.  3.  History of seizures. Will continue with old medications and check levels      of this medication if possible whenever they are available. Hopefully      the wife will bring them in this evening.  4.  Hyponatremia. Will monitor.  5.  Will also obtain CT scan of the brain in view of the possible seizures.                                                Mobolaji B. Corky Downs, M.D.    MBB/MEDQ  D:  09/15/2003  T:  09/15/2003  Job:  [id number]   cc:   Lovenia Kim, D.O.  [address]  [place]  [place] [address]  Fax: [phone]

## 2010-05-22 NOTE — Discharge Summary (Signed)
NAME:  Ian Sellers, Ian Sellers                             ACCOUNT NO.:  [id number]   MEDICAL RECORD NO.:  [id number]                   PATIENT TYPE:  INP   LOCATION:  4735                                 FACILITY:  [facility]   PHYSICIAN:  Mobolaji B. Corky Downs, M.D.            DATE OF BIRTH:  10/21/1934   DATE OF ADMISSION:  09/15/2003  DATE OF DISCHARGE:  09/17/2003                                 DISCHARGE SUMMARY   FINAL DIAGNOSIS:  Vasovagal syncope.   SECONDARY DIAGNOSES:  1.  History of seizures.  2.  Hypertension.  3.  History of recurrent syncope.  4.  History of ventriculoperitoneal shunt.   CHIEF COMPLAINT:  Passing out in church on the afternoon of admission.   BRIEF HISTORY:  The patient is a 76 year old white male with history of  recurrent syncope.  He was evaluated in the past by Dr. Myrtis Ser and also has  history of seizures evaluated by Dr. Thad Ranger.  He presented with symptoms  suggestive of vasovagal syncope; however, was incontinent, but there was no  seizure activity.   Please refer to complete History and Physical for further details.  For  pertinent investigations:  1.  CT of the head was negative for acute findings, no evidence of      intracranial abnormality, and stable left frontal encephalomalacia post      craniectomy.  2.  Cardiac enzymes negative.  3.  Tegretol level 8.1, within normal range.   HOSPITAL COURSE:  #1.  SYNCOPE:  The patient was admitted to rule out MI and did rule out for  MI.  No abnormal cardiac findings on telemetry.  However, EKG did show  normal sinus rhythm with first degree A-V block and sinus bradycardia at 57  which was not different from old EKG obtained in the office.  I did receive  some reports from the cardiologist's office and noted that patient had a  Holter monitor in December 2004 which showed sinus node dysfunction with  longest pause of 3.1 seconds and also had TEE done January 2005 which was  unremarkable.  EKG from the office  showed normal sinus rhythm with first  degree A-V block.  I did discuss with Dr. Eden Emms, covering for Dr. Myrtis Ser, and  it was felt that patient was stable at that point and could be followed up  in the office which I agreed with.  The patient remained stable during  hospitalization, no more episodes of syncope, and there was no seizure  activity.  He was subsequently discharged home.   #2.  PALPITATIONS:  The patient's blood pressure was controlled during  hospitalization.  He had an episode of blood pressure of 57/78 and also  186/80, predominantly systolic hypertension, and patient's blood pressure  was optimized with addition of Norvasc to his medications.   #3.  SEIZURE:  No seizure activity during this hospitalization.  Was placed  on  seizure precautions.  The patient remained stable, and he was instructed  to follow up with Dr. Myrtis Ser in the office during the following week and also  to follow up with Dr. Elisabeth Most one to two weeks and to call for  appointment, also to follow up with Dr. Thad Ranger, his neurologist.   The patient was discharged on the following medications.  He was instructed  to continue with his home medications and additionally Norvasc 10 mg p.o.  daily.   The patient did well and was discharged home in stable condition.       MBB/MEDQ  D:  09/23/2003  T:  09/23/2003  Job:  [id number]   cc:   Lovenia Kim, D.O.  [address]  [place]  [place] [address]  Fax: [phone]   Willa Rough, M.D.   Michael L. Thad Ranger, M.D.  [address]  [address]  [place]  [place] [address]  Fax: [phone]

## 2010-05-22 NOTE — Procedures (Signed)
HISTORY:  This is a 76 year old patient who has had a history of  hypertension, seizures, recurrent syncope, history of cerebral aneurysm. The  patient was passing out while praying in church. The patient is being  evaluated for possible seizure event. This is a routine EEG recording. No  skull defects were noted, but the patient apparently has a shunt on the  right side of the head.   MEDICATIONS:  Enalapril, doxazosin, aspirin, Tegretol.   EEG CLASSIFICATION:  Normal awake.   DESCRIPTION:  According to the background rhythms, this recording consists  of a fairly well modulated medium amplitude 7 to 8 hertz background activity  that is reactive to eye opening and closure. As the record progresses, there  does appear to be a mild asymmetry in amplitude ____________ next to the  right side being a bit higher than the left. Background rhythm activity,  however, remained fairly symmetric. Photic stimulation is performed  resulting in a bilateral and symmetric photic driving response.  Hyperventilation was not performed. At no time during the recording did  there appear to be evidence of actual spike or spike wave discharges or  evidence of focal slowing. EKG monitor showed no evidence of cardiac rhythm  abnormalities with a heart rate of 72.   IMPRESSION:  This is a normal EEG recording in the awakened state. No  evidence of ictal or interictal discharges are seen.    Marlan Palau, M.D.   ZOX:WRUE  D:  09/16/2003 20:18:22  T:  09/17/2003 07:52:25  Job #:  [id number]

## 2010-05-22 NOTE — Procedures (Signed)
Audio too short to transcribe (less than 5 seconds)    C. Lesia Sago, M.D.   SAY:[id number]  D:  09/16/2003 20:15:43  T:  09/16/2003 20:16:56  Job #:  [id number]

## 2010-06-29 ENCOUNTER — Encounter: Payer: Self-pay | Admitting: Internal Medicine

## 2010-09-25 LAB — I-STAT 8, (EC8 V) (CONVERTED LAB)
Acid-base deficit: 1
BUN: 13
Bicarbonate: 24.9 — ABNORMAL HIGH
Chloride: 100
Glucose, Bld: 122 — ABNORMAL HIGH
HCT: 39
Hemoglobin: 13.3
Operator id: 133351
Potassium: 4.2
Sodium: 131 — ABNORMAL LOW
TCO2: 26
pCO2, Ven: 46.2
pH, Ven: 7.339 — ABNORMAL HIGH

## 2010-09-25 LAB — CBC
HCT: 38.6 — ABNORMAL LOW
Hemoglobin: 13.3
MCHC: 34.6
MCV: 100.3 — ABNORMAL HIGH
Platelets: 222
RBC: 3.85 — ABNORMAL LOW
RDW: 12.7
WBC: 7.9

## 2010-09-25 LAB — DIFFERENTIAL
Basophils Absolute: 0
Basophils Relative: 0
Eosinophils Absolute: 0.1
Eosinophils Relative: 1
Lymphocytes Relative: 13
Lymphs Abs: 1
Monocytes Absolute: 0.7
Monocytes Relative: 9
Neutro Abs: 6.1
Neutrophils Relative %: 77

## 2010-09-25 LAB — POCT I-STAT CREATININE
Creatinine, Ser: 0.9
Operator id: 133351

## 2010-10-09 LAB — COMPREHENSIVE METABOLIC PANEL
ALT: 15
AST: 18
Alkaline Phosphatase: 60
CO2: 23
Chloride: 98
GFR calc Af Amer: >60
GFR calc non Af Amer: >60
Glucose, Bld: 103 — ABNORMAL HIGH
Potassium: 3.3 — ABNORMAL LOW
Sodium: 131 — ABNORMAL LOW

## 2010-10-09 LAB — POCT CARDIAC MARKERS
CKMB, poc: <1 — ABNORMAL LOW
Myoglobin, poc: 56.5
Operator id: 288331
Troponin i, poc: <0.05

## 2010-10-09 LAB — POCT I-STAT CREATININE: Operator id: 288331

## 2010-10-09 LAB — DIFFERENTIAL
Basophils Absolute: 0
Basophils Relative: 0
Eosinophils Absolute: 0.1 — ABNORMAL LOW
Eosinophils Relative: 1
Lymphocytes Relative: 10 — ABNORMAL LOW
Lymphs Abs: 0.8
Monocytes Absolute: 0.7
Monocytes Relative: 9
Neutro Abs: 6.3
Neutrophils Relative %: 80 — ABNORMAL HIGH

## 2010-10-09 LAB — PROTIME-INR
INR: 1
Prothrombin Time: 13.7

## 2010-10-09 LAB — I-STAT 8, (EC8 V) (CONVERTED LAB)
Acid-base deficit: 3 — ABNORMAL HIGH
Chloride: 99
Hemoglobin: 12.9 — ABNORMAL LOW
Potassium: 3.9
Sodium: 129 — ABNORMAL LOW
pH, Ven: 7.379 — ABNORMAL HIGH

## 2010-10-09 LAB — CBC
HCT: 36.6 — ABNORMAL LOW
Hemoglobin: 12.7 — ABNORMAL LOW
MCHC: 34.8
MCV: 97.3
RBC: 3.76 — ABNORMAL LOW

## 2010-10-09 LAB — TROPONIN I: Troponin I: 0.02

## 2011-03-29 ENCOUNTER — Emergency Department (HOSPITAL_COMMUNITY): Payer: Medicare Other

## 2011-03-29 ENCOUNTER — Encounter (HOSPITAL_COMMUNITY): Payer: Self-pay | Admitting: Neurology

## 2011-03-29 ENCOUNTER — Emergency Department (HOSPITAL_COMMUNITY)
Admission: EM | Admit: 2011-03-29 | Discharge: 2011-03-29 | Disposition: A | Discharge status: Home / Self Care | Payer: Medicare Other | Attending: Emergency Medicine | Admitting: Emergency Medicine

## 2011-03-29 ENCOUNTER — Other Ambulatory Visit: Payer: Self-pay

## 2011-03-29 DIAGNOSIS — R040 Epistaxis: Secondary | ICD-10-CM | POA: Insufficient documentation

## 2011-03-29 DIAGNOSIS — R55 Syncope and collapse: Secondary | ICD-10-CM | POA: Insufficient documentation

## 2011-03-29 DIAGNOSIS — I10 Essential (primary) hypertension: Secondary | ICD-10-CM | POA: Insufficient documentation

## 2011-03-29 DIAGNOSIS — S0083XA Contusion of other part of head, initial encounter: Secondary | ICD-10-CM

## 2011-03-29 DIAGNOSIS — S1093XA Contusion of unspecified part of neck, initial encounter: Secondary | ICD-10-CM | POA: Insufficient documentation

## 2011-03-29 DIAGNOSIS — R799 Abnormal finding of blood chemistry, unspecified: Secondary | ICD-10-CM | POA: Insufficient documentation

## 2011-03-29 DIAGNOSIS — S0990XA Unspecified injury of head, initial encounter: Secondary | ICD-10-CM | POA: Insufficient documentation

## 2011-03-29 DIAGNOSIS — S0003XA Contusion of scalp, initial encounter: Secondary | ICD-10-CM | POA: Insufficient documentation

## 2011-03-29 DIAGNOSIS — R7889 Finding of other specified substances, not normally found in blood: Secondary | ICD-10-CM

## 2011-03-29 DIAGNOSIS — Z79899 Other long term (current) drug therapy: Secondary | ICD-10-CM | POA: Insufficient documentation

## 2011-03-29 DIAGNOSIS — R404 Transient alteration of awareness: Secondary | ICD-10-CM | POA: Insufficient documentation

## 2011-03-29 DIAGNOSIS — Z982 Presence of cerebrospinal fluid drainage device: Secondary | ICD-10-CM | POA: Insufficient documentation

## 2011-03-29 DIAGNOSIS — R296 Repeated falls: Secondary | ICD-10-CM | POA: Insufficient documentation

## 2011-03-29 LAB — CBC
HCT: 34.9 % — ABNORMAL LOW (ref 39.0–52.0)
MCHC: 36.4 g/dL — ABNORMAL HIGH (ref 30.0–36.0)
MCV: 94.3 fL (ref 78.0–100.0)
Platelets: 189 10*3/uL (ref 150–400)
RDW: 12.6 % (ref 11.5–15.5)
WBC: 7.6 10*3/uL (ref 4.0–10.5)

## 2011-03-29 LAB — URINALYSIS, ROUTINE W REFLEX MICROSCOPIC
Glucose, UA: NEGATIVE mg/dL
Hgb urine dipstick: NEGATIVE
Leukocytes, UA: NEGATIVE
Protein, ur: NEGATIVE mg/dL
pH: 6 (ref 5.0–8.0)

## 2011-03-29 LAB — COMPREHENSIVE METABOLIC PANEL
AST: 18 U/L (ref 0–37)
Albumin: 3.4 g/dL — ABNORMAL LOW (ref 3.5–5.2)
BUN: 19 mg/dL (ref 6–23)
Chloride: 102 mEq/L (ref 96–112)
Creatinine, Ser: 0.99 mg/dL (ref 0.50–1.35)
Potassium: 3.8 mEq/L (ref 3.5–5.1)
Total Bilirubin: 0.3 mg/dL (ref 0.3–1.2)
Total Protein: 5.3 g/dL — ABNORMAL LOW (ref 6.0–8.3)

## 2011-03-29 LAB — CARBAMAZEPINE LEVEL, TOTAL: Carbamazepine Lvl: 14.5 ug/mL — ABNORMAL HIGH (ref 4.0–12.0)

## 2011-03-29 MED ORDER — TETANUS-DIPHTH-ACELL PERTUSSIS 5-2.5-18.5 LF-MCG/0.5 IM SUSP
0.5000 mL | Freq: Once | INTRAMUSCULAR | Status: AC
  Administered 2011-03-29: 0.5 mL via INTRAMUSCULAR
  Filled 2011-03-29 (×2): qty 0.5

## 2011-03-29 NOTE — Discharge Instructions (Signed)
Icepack/cold to sore area. Take tylenol/advil as need.   Follow up with your doctor in the next 1-2 days for recheck. From todays labs, your tegretol level is mildly elevated (14.5) - call your doctor today and discuss this result, and inquire as to whether they want to decrease your dose, or change you back to your previous medication.  Your ct scans were read as showing no acute fracture or injury - note was made of degenerative changes/degenerative disc in the cervical region.   Return to ER if worse, symptoms recur, faint, chest discomfort, palpitations or fast heart beat, weak/faint, fevers, seizure, other concern.       Syncope You have had a fainting (syncopal) spell. A fainting episode is a sudden, short-lived loss of consciousness. It results in complete recovery. It occurs because there has been a temporary shortage of oxygen and/or sugar (glucose) to the brain. CAUSES   Blood pressure pills and other medications that may lower blood pressure below normal. Sudden changes in posture (sudden standing).   Over-medication. Take your medications as directed.   Standing too long. This can cause blood to pool in the legs.   Seizure disorders.   Low blood sugar (hypoglycemia) of diabetes. This more commonly causes coma.   Bearing down to go to the bathroom. This can cause your blood pressure to rise suddenly. Your body compensates by making the blood pressure too low when you stop bearing down.   Hardening of the arteries where the brain temporarily does not receive enough blood.   Irregular heart beat and circulatory problems.   Fear, emotional distress, injury, sight of blood, or illness.  Your caregiver will send you home if the syncope was from non-worrisome causes (benign). Depending on your age and health, you may stay to be monitored and observed. If you return home, have someone stay with you if your caregiver feels that is desirable. It is very important to keep all  follow-up referrals and appointments in order to properly manage this condition. This is a serious problem which can lead to serious illness and death if not carefully managed.  WARNING: Do not drive or operate machinery until your caregiver feels that it is safe for you to do so. SEEK IMMEDIATE MEDICAL CARE IF:   You have another fainting episode or faint while lying or sitting down. DO NOT DRIVE YOURSELF. Call 911 if no other help is available.   You have chest pain, are feeling sick to your stomach (nausea), vomiting or abdominal pain.   You have an irregular heartbeat or one that is very fast (pulse over 120 beats per minute).   You have a loss of feeling in some part of your body or lose movement in your arms or legs.   You have difficulty with speech, confusion, severe weakness, or visual problems.   You become sweaty and/or feel light headed.  Make sure you are rechecked as instructed. Document Released: 12/21/2004 Document Revised: 12/10/2010 Document Reviewed: 08/11/2006 [facility] Patient Information 2012 [place], [place].      Facial and Scalp Contusions You have a contusion (bruise) on your face or scalp. Injuries around the face and head generally cause a lot of swelling, especially around the eyes. This is because the blood supply to this area is good and tissues are loose. Swelling from a contusion is usually better in 2-3 days. It may take a week or longer for a "black eye" to clear up completely. HOME CARE INSTRUCTIONS   Apply ice packs  to the injured area for about 15 to 20 minutes, 3 to 4 times a [place], for the first couple days. This helps keep swelling down.   Use mild pain medicine as needed or instructed by your caregiver.   You may have a mild headache, slight dizziness, nausea, and weakness for a few days. This usually clears up with bed rest and mild pain medications.   Contact your caregiver if you are concerned about facial defects or have any difficulty with  your bite or develop pain with chewing.  SEEK IMMEDIATE MEDICAL CARE IF:  You develop severe pain or a headache, unrelieved by medication.   You develop unusual sleepiness, confusion, personality changes, or vomiting.   You have a persistent nosebleed, double or blurred vision, or drainage from the nose or ear.   You have difficulty walking or using your arms or legs.  MAKE SURE YOU:   Understand these instructions.   Will watch your condition.   Will get help right away if you are not doing well or get worse.  Document Released: 01/29/2004 Document Revised: 12/10/2010 Document Reviewed: 11/06/2010 [facility] Patient Information 2012 [place], [place].      Cryotherapy Cryotherapy means treatment with cold. Ice or gel packs can be used to reduce both pain and swelling. Ice is the most helpful within the first 24 to 48 hours after an injury or flareup from overusing a muscle or joint. Sprains, strains, spasms, burning pain, shooting pain, and aches can all be eased with ice. Ice can also be used when recovering from surgery. Ice is effective, has very few side effects, and is safe for most people to use. PRECAUTIONS  Ice is not a safe treatment option for people with:  Raynaud's phenomenon. This is a condition affecting small blood vessels in the extremities. Exposure to cold may cause your problems to return.   Cold hypersensitivity. There are many forms of cold hypersensitivity, including:   Cold urticaria. Red, itchy hives appear on the skin when the tissues begin to warm after being iced.   Cold erythema. This is a red, itchy rash caused by exposure to cold.   Cold hemoglobinuria. Red blood cells break down when the tissues begin to warm after being iced. The hemoglobin that carry oxygen are passed into the urine because they cannot combine with blood proteins fast enough.   Numbness or altered sensitivity in the area being iced.  If you have any of the following conditions, do  not use ice until you have discussed cryotherapy with your caregiver:  Heart conditions, such as arrhythmia, angina, or chronic heart disease.   High blood pressure.   Healing wounds or open skin in the area being iced.   Current infections.   Rheumatoid arthritis.   Poor circulation.   Diabetes.  Ice slows the blood flow in the region it is applied. This is beneficial when trying to stop inflamed tissues from spreading irritating chemicals to surrounding tissues. However, if you expose your skin to cold temperatures for too long or without the proper protection, you can damage your skin or nerves. Watch for signs of skin damage due to cold. HOME CARE INSTRUCTIONS Follow these tips to use ice and cold packs safely.  Place a dry or damp towel between the ice and skin. A damp towel will cool the skin more quickly, so you may need to shorten the time that the ice is used.   For a more rapid response, add gentle compression to the ice.  Ice for no more than 10 to 20 minutes at a time. The bonier the area you are icing, the less time it will take to get the benefits of ice.   Check your skin after 5 minutes to make sure there are no signs of a poor response to cold or skin damage.   Rest 20 minutes or more in between uses.   Once your skin is numb, you can end your treatment. You can test numbness by very lightly touching your skin. The touch should be so light that you do not see the skin dimple from the pressure of your fingertip. When using ice, most people will feel these normal sensations in this order: cold, burning, aching, and numbness.   Do not use ice on someone who cannot communicate their responses to pain, such as small children or people with dementia.  HOW TO MAKE AN ICE PACK Ice packs are the most common way to use ice therapy. Other methods include ice massage, ice baths, and cryo-sprays. Muscle creams that cause a cold, tingly feeling do not offer the same benefits that  ice offers and should not be used as a substitute unless recommended by your caregiver. To make an ice pack, do one of the following:  Place crushed ice or a bag of frozen vegetables in a sealable plastic bag. Squeeze out the excess air. Place this bag inside another plastic bag. Slide the bag into a pillowcase or place a damp towel between your skin and the bag.   Mix 3 parts water with 1 part rubbing alcohol. Freeze the mixture in a sealable plastic bag. When you remove the mixture from the freezer, it will be slushy. Squeeze out the excess air. Place this bag inside another plastic bag. Slide the bag into a pillowcase or place a damp towel between your skin and the bag.  SEEK MEDICAL CARE IF:  You develop white spots on your skin. This may give the skin a blotchy (mottled) appearance.   Your skin turns blue or pale.   Your skin becomes waxy or hard.   Your swelling gets worse.  MAKE SURE YOU:   Understand these instructions.   Will watch your condition.   Will get help right away if you are not doing well or get worse.  Document Released: 08/17/2010 Document Revised: 12/10/2010 Document Reviewed: 08/17/2010 [facility] Patient Information 2012 [place], [place].

## 2011-03-29 NOTE — ED Notes (Addendum)
Per ems-pt comes from home. Called wife c/o dizziness, pt went outside, passed out, positive LOC. Regained consciousness and crawled back into the home. Pt has bloody nose, pt appearing pale and weak. Alert and oriented. 70/40, 500 cc fluid, 130/70 last pressure, HR 60-70 SR. EKG shows left bundle branch block, unsure if new. Airway intact. Positive for blood thinners but unsure what he takes

## 2011-03-29 NOTE — ED Provider Notes (Signed)
History     CSN: [id number]  Arrival date & time 03/29/11  1301   First MD Initiated Contact with Patient 03/29/11 1315      Chief Complaint  Patient presents with  . Loss of Consciousness    (Consider location/radiation/quality/duration/timing/severity/associated sxs/prior treatment) The history is provided by the patient.  pt w syncopal event at home. Had used bathroom.bm, went outside, felt faint, syncopal event. Brief loc. States long hx fainting episodes. Denies palpitations or irregular heartbeat. No hx dysrhythmia or cad.  Denies any current or recent chest discomfort. No sob. No headaches. No abd pain. No nvd. Normal bms, no melena or rectal bleeding. States this morning felt normal, at baseline, no symptoms. Denies fever or chills. Denies seizure. Denies recent change in meds. Post syncope/fall, hit face/head on group, had nosebleed which stopped. Denies other pain or injury. Last tetanus unknown.   Past Medical History  Diagnosis Date  . Intracerebral hemorrhage   . Bradycardia   . Personal history of other disorders of nervous system and sense organs     seizure   . Syncope and collapse   . Cerebral aneurysm, nonruptured   . HTN (hypertension)     Past Surgical History  Procedure Date  . Colonoscopy     x2. one w/hot biopsy. one w/ polypectomy  . Cerebral aneurysm repair     s/p surgery  . Ventriculoperitoneal shunt     No family history on file.  History  Substance Use Topics  . Smoking status: Not on file  . Smokeless tobacco: Not on file  . Alcohol Use: No      Review of Systems  Constitutional: Negative for fever.  HENT: Negative for neck pain.   Eyes: Negative for pain and visual disturbance.  Respiratory: Negative for cough and shortness of breath.   Cardiovascular: Negative for chest pain and palpitations.  Gastrointestinal: Negative for vomiting, abdominal pain, diarrhea and blood in stool.  Genitourinary: Negative for dysuria and flank pain.   Musculoskeletal: Negative for back pain.  Skin: Negative for rash.  Neurological: Negative for seizures, weakness, numbness and headaches.  Hematological: Does not bruise/bleed easily.  Psychiatric/Behavioral: Negative for confusion.    Allergies  Sulfa antibiotics and Sulfonamide derivatives  Home Medications   Current Outpatient Rx  Name Route Sig Dispense Refill  . AMLODIPINE BESYLATE 10 MG PO TABS Oral Take 10 mg by mouth daily.      Marland Kitchen CALCIUM CARBONATE-VITAMIN D 500-125 MG-UNIT PO TABS Oral Take 1 tablet by mouth daily.      Marland Kitchen CARBAMAZEPINE ER 300 MG PO CP12 Oral Take 600 mg by mouth 2 (two) times daily.      Marland Kitchen VITAMIN D3 1000 UNITS PO TABS Oral Take 3,000 Units by mouth 2 (two) times daily.     Marland Kitchen CLONAZEPAM 1 MG PO TABS Oral Take 1 mg by mouth 3 (three) times daily as needed. For anxiety    . DOCUSATE SODIUM 100 MG PO CAPS Oral Take 100 mg by mouth 2 (two) times daily as needed. For constipation    . DOXAZOSIN MESYLATE 4 MG PO TABS Oral Take 4 mg by mouth daily.      . ENALAPRIL MALEATE 20 MG PO TABS Oral Take 20 mg by mouth daily.      Marland Kitchen ONE-DAILY MULTI VITAMINS PO TABS Oral Take 1 tablet by mouth daily.        BP 151/56  Pulse 75  Temp(Src) 97.6 F (36.4 C) (Oral)  Resp 16  SpO2 96%  Physical Exam  Nursing note and vitals reviewed. Constitutional: He is oriented to person, place, and time. He appears well-developed and well-nourished. No distress.  HENT:       Contusion face and nose. Dried blood bil nares. No septal hematoma. Dentures out. Contused upper gums. No malocclusion.  Shunt palp right scalp.   Eyes: Conjunctivae are normal. Pupils are equal, round, and reactive to light.  Neck: Neck supple. No tracheal deviation present.       No bruit  Cardiovascular: Normal rate, regular rhythm, normal heart sounds and intact distal pulses.  Exam reveals no gallop and no friction rub.   No murmur heard. Pulmonary/Chest: Effort normal and breath sounds normal. No  accessory muscle usage. No respiratory distress. He exhibits no tenderness.  Abdominal: Soft. Bowel sounds are normal. He exhibits no distension. There is no tenderness.       No puls mass  Musculoskeletal: Normal range of motion. He exhibits no edema and no tenderness.       Good rom bil ext without focal bony tenderness  Neurological: He is alert and oriented to person, place, and time.       Motor intact bil.   Skin: Skin is warm and dry.  Psychiatric: He has a normal mood and affect.    ED Course  Procedures (including critical care time)  Results for orders placed during the hospital encounter of 03/29/11  CBC      Component Value Range   WBC 7.6  4.0 - 10.5 (K/uL)   RBC 3.70 (*) 4.22 - 5.81 (MIL/uL)   Hemoglobin 12.7 (*) 13.0 - 17.0 (g/dL)   HCT 16.1 (*) 09.6 - 52.0 (%)   MCV 94.3  78.0 - 100.0 (fL)   MCH 34.3 (*) 26.0 - 34.0 (pg)   MCHC 36.4 (*) 30.0 - 36.0 (g/dL)   RDW 04.5  40.9 - 81.1 (%)   Platelets 189  150 - 400 (K/uL)  COMPREHENSIVE METABOLIC PANEL      Component Value Range   Sodium 134 (*) 135 - 145 (mEq/L)   Potassium 3.8  3.5 - 5.1 (mEq/L)   Chloride 102  96 - 112 (mEq/L)   CO2 23  19 - 32 (mEq/L)   Glucose, Bld 178 (*) 70 - 99 (mg/dL)   BUN 19  6 - 23 (mg/dL)   Creatinine, Ser 9.14  0.50 - 1.35 (mg/dL)   Calcium 8.3 (*) 8.4 - 10.5 (mg/dL)   Total Protein 5.3 (*) 6.0 - 8.3 (g/dL)   Albumin 3.4 (*) 3.5 - 5.2 (g/dL)   AST 18  0 - 37 (U/L)   ALT 15  0 - 53 (U/L)   Alkaline Phosphatase 59  39 - 117 (U/L)   Total Bilirubin 0.3  0.3 - 1.2 (mg/dL)   GFR calc non Af Amer 79 (*) >90 (mL/min)   GFR calc Af Amer >90  >90 (mL/min)  URINALYSIS, ROUTINE W REFLEX MICROSCOPIC      Component Value Range   Color, Urine YELLOW  YELLOW    APPearance CLEAR  CLEAR    Specific Gravity, Urine 1.018  1.005 - 1.030    pH 6.0  5.0 - 8.0    Glucose, UA NEGATIVE  NEGATIVE (mg/dL)   Hgb urine dipstick NEGATIVE  NEGATIVE    Bilirubin Urine NEGATIVE  NEGATIVE    Ketones, ur  NEGATIVE  NEGATIVE (mg/dL)   Protein, ur NEGATIVE  NEGATIVE (mg/dL)   Urobilinogen, UA 1.0  0.0 - 1.0 (  mg/dL)   Nitrite NEGATIVE  NEGATIVE    Leukocytes, UA NEGATIVE  NEGATIVE   CARBAMAZEPINE LEVEL, TOTAL      Component Value Range   Carbamazepine Lvl 14.5 (*) 4.0 - 12.0 (ug/mL)   Ct Head Wo Contrast  03/29/2011  *RADIOLOGY REPORT*  Clinical Data:  Fall.  Facial contusion  CT HEAD WITHOUT CONTRAST CT MAXILLOFACIAL WITHOUT CONTRAST CT CERVICAL SPINE WITHOUT CONTRAST  Technique:  Multidetector CT imaging of the head, cervical spine, and maxillofacial structures were performed using the standard protocol without intravenous contrast. Multiplanar CT image reconstructions of the cervical spine and maxillofacial structures were also generated.  Comparison:  CT 12/31/2009  CT HEAD  Findings: Left frontal craniotomy and cranioplasty.  This is unchanged.  Right frontal ventricular shunt catheter with the tip in the right lateral ventricle, unchanged.  Ventricle size is normal.  Chronic encephalomalacia in the left frontal lobe and left internal capsule unchanged.  Chronic encephalomalacia right frontal white matter and right internal capsule, unchanged.  Negative for acute infarct.  Negative for acute hemorrhage or mass.  IMPRESSION: No acute abnormality and no change from the prior CT.  CT MAXILLOFACIAL  Findings:  Chronic sinusitis.  Mucosal edema in the maxillary, ethmoid, and left frontal sinuses.  No air-fluid level.  Negative for facial fracture.  No orbital fracture is present. Dental infection with lucency around the remaining lower teeth.  IMPRESSION: Negative for facial fracture.  Chronic sinusitis.  CT CERVICAL SPINE  Findings:   Negative for cervical spine fracture.  2 mm anterior slip C3-4 and C4-5 related to disc and facet degeneration.  Marked disc degeneration and spurring C5-6. Moderate spondylosis C6-7.  Diffuse facet degeneration in the cervical spine. Carotid atherosclerotic disease with  calcification.  IMPRESSION: Cervical degenerative changes.  Negative for fracture.  Original Report Authenticated By: Camelia Phenes, M.D.   Ct Cervical Spine Wo Contrast  03/29/2011  *RADIOLOGY REPORT*  Clinical Data:  Fall.  Facial contusion  CT HEAD WITHOUT CONTRAST CT MAXILLOFACIAL WITHOUT CONTRAST CT CERVICAL SPINE WITHOUT CONTRAST  Technique:  Multidetector CT imaging of the head, cervical spine, and maxillofacial structures were performed using the standard protocol without intravenous contrast. Multiplanar CT image reconstructions of the cervical spine and maxillofacial structures were also generated.  Comparison:  CT 12/31/2009  CT HEAD  Findings: Left frontal craniotomy and cranioplasty.  This is unchanged.  Right frontal ventricular shunt catheter with the tip in the right lateral ventricle, unchanged.  Ventricle size is normal.  Chronic encephalomalacia in the left frontal lobe and left internal capsule unchanged.  Chronic encephalomalacia right frontal white matter and right internal capsule, unchanged.  Negative for acute infarct.  Negative for acute hemorrhage or mass.  IMPRESSION: No acute abnormality and no change from the prior CT.  CT MAXILLOFACIAL  Findings:  Chronic sinusitis.  Mucosal edema in the maxillary, ethmoid, and left frontal sinuses.  No air-fluid level.  Negative for facial fracture.  No orbital fracture is present. Dental infection with lucency around the remaining lower teeth.  IMPRESSION: Negative for facial fracture.  Chronic sinusitis.  CT CERVICAL SPINE  Findings:   Negative for cervical spine fracture.  2 mm anterior slip C3-4 and C4-5 related to disc and facet degeneration.  Marked disc degeneration and spurring C5-6. Moderate spondylosis C6-7.  Diffuse facet degeneration in the cervical spine. Carotid atherosclerotic disease with calcification.  IMPRESSION: Cervical degenerative changes.  Negative for fracture.  Original Report Authenticated By: Camelia Phenes, M.D.   Ct  Maxillofacial Wo Cm  03/29/2011  *RADIOLOGY REPORT*  Clinical Data:  Fall.  Facial contusion  CT HEAD WITHOUT CONTRAST CT MAXILLOFACIAL WITHOUT CONTRAST CT CERVICAL SPINE WITHOUT CONTRAST  Technique:  Multidetector CT imaging of the head, cervical spine, and maxillofacial structures were performed using the standard protocol without intravenous contrast. Multiplanar CT image reconstructions of the cervical spine and maxillofacial structures were also generated.  Comparison:  CT 12/31/2009  CT HEAD  Findings: Left frontal craniotomy and cranioplasty.  This is unchanged.  Right frontal ventricular shunt catheter with the tip in the right lateral ventricle, unchanged.  Ventricle size is normal.  Chronic encephalomalacia in the left frontal lobe and left internal capsule unchanged.  Chronic encephalomalacia right frontal white matter and right internal capsule, unchanged.  Negative for acute infarct.  Negative for acute hemorrhage or mass.  IMPRESSION: No acute abnormality and no change from the prior CT.  CT MAXILLOFACIAL  Findings:  Chronic sinusitis.  Mucosal edema in the maxillary, ethmoid, and left frontal sinuses.  No air-fluid level.  Negative for facial fracture.  No orbital fracture is present. Dental infection with lucency around the remaining lower teeth.  IMPRESSION: Negative for facial fracture.  Chronic sinusitis.  CT CERVICAL SPINE  Findings:   Negative for cervical spine fracture.  2 mm anterior slip C3-4 and C4-5 related to disc and facet degeneration.  Marked disc degeneration and spurring C5-6. Moderate spondylosis C6-7.  Diffuse facet degeneration in the cervical spine. Carotid atherosclerotic disease with calcification.  IMPRESSION: Cervical degenerative changes.  Negative for fracture.  Original Report Authenticated By: Camelia Phenes, M.D.       MDM  Monitor. Ecg. Labs. Ct.   Reviewed prior charts/[facility] cardiology notes - pt followed by cardiology due to hx 'neurally mediated syncope'  most commonly related to bathroom use.  Pt denies any recent abrupt increase in frequency of syncopal events.     Date: 03/29/2011  Rate: 64  Rhythm: normal sinus rhythm  QRS Axis: left  Intervals: normal  ST/T Wave abnormalities: normal  Conduction Disutrbances:first-degree A-V block  and nonspecific intraventricular conduction delay  Narrative Interpretation:   Old EKG Reviewed: unchanged   While in ed, on monitor, remains in nsr. Recheck spine nt. Pt states feels fine, at baseline, requests d/c. cts neg acute.  Spouse present, discussed symptoms, labs, etc. Pt/spouse deny any recent seizures. Discussed tegretol level being mildly elevated - they will discuss with their doctor today. Spouse states more recently on tegretol whereas previously on carbatrol - they will discuss w pcp.       Suzi Roots, MD 03/29/11 [phone]

## 2011-03-29 NOTE — ED Notes (Signed)
Pt has bottom set of dentures in place.

## 2011-03-30 ENCOUNTER — Ambulatory Visit (HOSPITAL_COMMUNITY)
Admission: RE | Admit: 2011-03-30 | Discharge: 2011-03-30 | Disposition: A | Discharge status: Home / Self Care | Payer: Medicare Other | Source: Ambulatory Visit | Attending: Internal Medicine | Admitting: Internal Medicine

## 2011-03-30 ENCOUNTER — Other Ambulatory Visit (HOSPITAL_COMMUNITY): Payer: Self-pay | Admitting: Internal Medicine

## 2011-03-30 DIAGNOSIS — S60219A Contusion of unspecified wrist, initial encounter: Secondary | ICD-10-CM

## 2011-03-30 DIAGNOSIS — X58XXXA Exposure to other specified factors, initial encounter: Secondary | ICD-10-CM | POA: Insufficient documentation

## 2011-03-30 DIAGNOSIS — S62113A Displaced fracture of triquetrum [cuneiform] bone, unspecified wrist, initial encounter for closed fracture: Secondary | ICD-10-CM | POA: Insufficient documentation

## 2011-05-10 ENCOUNTER — Encounter: Payer: Self-pay | Admitting: Internal Medicine

## 2011-11-01 ENCOUNTER — Other Ambulatory Visit: Payer: Self-pay | Admitting: Internal Medicine

## 2011-11-01 ENCOUNTER — Ambulatory Visit
Admission: RE | Admit: 2011-11-01 | Discharge: 2011-11-01 | Disposition: A | Discharge status: Home / Self Care | Payer: Medicare Other | Source: Ambulatory Visit | Attending: Internal Medicine | Admitting: Internal Medicine

## 2011-11-01 DIAGNOSIS — R404 Transient alteration of awareness: Secondary | ICD-10-CM

## 2011-11-04 ENCOUNTER — Other Ambulatory Visit: Payer: Self-pay | Admitting: Internal Medicine

## 2011-11-04 DIAGNOSIS — I10 Essential (primary) hypertension: Secondary | ICD-10-CM

## 2011-11-05 ENCOUNTER — Other Ambulatory Visit: Payer: Self-pay | Admitting: Internal Medicine

## 2011-11-05 ENCOUNTER — Other Ambulatory Visit: Payer: Medicare Other

## 2011-11-05 DIAGNOSIS — I10 Essential (primary) hypertension: Secondary | ICD-10-CM

## 2011-11-09 ENCOUNTER — Ambulatory Visit
Admission: RE | Admit: 2011-11-09 | Discharge: 2011-11-09 | Disposition: A | Discharge status: Home / Self Care | Payer: Medicare Other | Source: Ambulatory Visit | Attending: Internal Medicine | Admitting: Internal Medicine

## 2011-11-09 DIAGNOSIS — I10 Essential (primary) hypertension: Secondary | ICD-10-CM

## 2012-05-20 ENCOUNTER — Emergency Department (HOSPITAL_COMMUNITY): Payer: Medicare Other

## 2012-05-20 ENCOUNTER — Encounter (HOSPITAL_COMMUNITY): Payer: Self-pay

## 2012-05-20 ENCOUNTER — Inpatient Hospital Stay (HOSPITAL_COMMUNITY)
Admission: EM | Admit: 2012-05-20 | Discharge: 2012-05-23 | DRG: 312 | Disposition: A | Discharge status: Home / Self Care | Payer: Medicare Other | Attending: Internal Medicine | Admitting: Internal Medicine

## 2012-05-20 DIAGNOSIS — Z8669 Personal history of other diseases of the nervous system and sense organs: Secondary | ICD-10-CM

## 2012-05-20 DIAGNOSIS — F0390 Unspecified dementia without behavioral disturbance: Secondary | ICD-10-CM | POA: Diagnosis present

## 2012-05-20 DIAGNOSIS — I671 Cerebral aneurysm, nonruptured: Secondary | ICD-10-CM

## 2012-05-20 DIAGNOSIS — I10 Essential (primary) hypertension: Secondary | ICD-10-CM | POA: Diagnosis present

## 2012-05-20 DIAGNOSIS — R55 Syncope and collapse: Principal | ICD-10-CM | POA: Diagnosis present

## 2012-05-20 DIAGNOSIS — Y92009 Unspecified place in unspecified non-institutional (private) residence as the place of occurrence of the external cause: Secondary | ICD-10-CM

## 2012-05-20 DIAGNOSIS — E871 Hypo-osmolality and hyponatremia: Secondary | ICD-10-CM | POA: Diagnosis present

## 2012-05-20 DIAGNOSIS — W19XXXA Unspecified fall, initial encounter: Secondary | ICD-10-CM | POA: Diagnosis present

## 2012-05-20 DIAGNOSIS — F17200 Nicotine dependence, unspecified, uncomplicated: Secondary | ICD-10-CM | POA: Diagnosis present

## 2012-05-20 DIAGNOSIS — I498 Other specified cardiac arrhythmias: Secondary | ICD-10-CM

## 2012-05-20 DIAGNOSIS — I619 Nontraumatic intracerebral hemorrhage, unspecified: Secondary | ICD-10-CM

## 2012-05-20 HISTORY — DX: Unspecified dementia, unspecified severity, without behavioral disturbance, psychotic disturbance, mood disturbance, and anxiety: F03.90

## 2012-05-20 LAB — CBC
HCT: 33.4 % — ABNORMAL LOW (ref 39.0–52.0)
Hemoglobin: 12.2 g/dL — ABNORMAL LOW (ref 13.0–17.0)
MCH: 33.1 pg (ref 26.0–34.0)
MCHC: 36.5 g/dL — ABNORMAL HIGH (ref 30.0–36.0)
RBC: 3.69 MIL/uL — ABNORMAL LOW (ref 4.22–5.81)

## 2012-05-20 LAB — COMPREHENSIVE METABOLIC PANEL
ALT: 16 U/L (ref 0–53)
AST: 14 U/L (ref 0–37)
Alkaline Phosphatase: 82 U/L (ref 39–117)
CO2: 25 mEq/L (ref 19–32)
Calcium: 8.6 mg/dL (ref 8.4–10.5)
Chloride: 88 mEq/L — ABNORMAL LOW (ref 96–112)
GFR calc Af Amer: >90 mL/min (ref >90)
GFR calc non Af Amer: 82 mL/min — ABNORMAL LOW (ref >90)
Glucose, Bld: 112 mg/dL — ABNORMAL HIGH (ref 70–99)
Sodium: 124 mEq/L — ABNORMAL LOW (ref 135–145)
Total Bilirubin: 0.2 mg/dL — ABNORMAL LOW (ref 0.3–1.2)

## 2012-05-20 LAB — URINALYSIS, ROUTINE W REFLEX MICROSCOPIC
Glucose, UA: NEGATIVE mg/dL
Hgb urine dipstick: NEGATIVE
Protein, ur: NEGATIVE mg/dL
pH: 6.5 (ref 5.0–8.0)

## 2012-05-20 LAB — POCT I-STAT TROPONIN I: Troponin i, poc: 0.14 ng/mL (ref 0.00–0.08)

## 2012-05-20 LAB — TROPONIN I: Troponin I: <0.3 ng/mL (ref <0.30)

## 2012-05-20 MED ORDER — HYDROCODONE-ACETAMINOPHEN 5-325 MG PO TABS
1.0000 | ORAL_TABLET | ORAL | Status: DC | PRN
  Administered 2012-05-22: 1 via ORAL
  Administered 2012-05-22: 2 via ORAL
  Filled 2012-05-20: qty 2
  Filled 2012-05-20: qty 1

## 2012-05-20 MED ORDER — SODIUM CHLORIDE 0.9 % IV SOLN
INTRAVENOUS | Status: DC
  Administered 2012-05-20: 75 mL/h via INTRAVENOUS

## 2012-05-20 MED ORDER — DOXAZOSIN MESYLATE 2 MG PO TABS
2.0000 mg | ORAL_TABLET | Freq: Two times a day (BID) | ORAL | Status: DC
  Administered 2012-05-20 – 2012-05-23 (×7): 2 mg via ORAL
  Filled 2012-05-20 (×8): qty 1

## 2012-05-20 MED ORDER — HYDRALAZINE HCL 20 MG/ML IJ SOLN
5.0000 mg | Freq: Four times a day (QID) | INTRAMUSCULAR | Status: DC | PRN
  Administered 2012-05-20 – 2012-05-21 (×2): 5 mg via INTRAVENOUS
  Filled 2012-05-20 (×3): qty 1

## 2012-05-20 MED ORDER — CARBAMAZEPINE ER 200 MG PO TB12
200.0000 mg | ORAL_TABLET | Freq: Three times a day (TID) | ORAL | Status: DC
  Administered 2012-05-20 – 2012-05-21 (×4): 200 mg via ORAL
  Filled 2012-05-20 (×6): qty 1

## 2012-05-20 MED ORDER — SODIUM CHLORIDE 0.9 % IJ SOLN
3.0000 mL | Freq: Two times a day (BID) | INTRAMUSCULAR | Status: DC
  Administered 2012-05-20 – 2012-05-23 (×6): 3 mL via INTRAVENOUS

## 2012-05-20 MED ORDER — ASPIRIN 81 MG PO CHEW
324.0000 mg | CHEWABLE_TABLET | Freq: Once | ORAL | Status: AC
  Administered 2012-05-20: 324 mg via ORAL
  Filled 2012-05-20: qty 4

## 2012-05-20 MED ORDER — ENOXAPARIN SODIUM 40 MG/0.4ML ~~LOC~~ SOLN
40.0000 mg | SUBCUTANEOUS | Status: DC
  Administered 2012-05-20 – 2012-05-23 (×4): 40 mg via SUBCUTANEOUS
  Filled 2012-05-20 (×4): qty 0.4

## 2012-05-20 MED ORDER — ONDANSETRON HCL 4 MG/2ML IJ SOLN: 4.0000 mg | Freq: Four times a day (QID) | INTRAMUSCULAR | Status: DC | PRN

## 2012-05-20 MED ORDER — SODIUM CHLORIDE 0.9 % IV SOLN
INTRAVENOUS | Status: DC
  Administered 2012-05-20: 02:00:00 via INTRAVENOUS

## 2012-05-20 MED ORDER — CARBAMAZEPINE ER 300 MG PO CP12: 300.0000 mg | ORAL_CAPSULE | Freq: Three times a day (TID) | ORAL | Status: DC

## 2012-05-20 MED ORDER — ONDANSETRON HCL 4 MG PO TABS: 4.0000 mg | ORAL_TABLET | Freq: Four times a day (QID) | ORAL | Status: DC | PRN

## 2012-05-20 MED ORDER — CARBAMAZEPINE ER 200 MG PO TB12
300.0000 mg | ORAL_TABLET | Freq: Three times a day (TID) | ORAL | Status: DC
  Filled 2012-05-20 (×4): qty 1

## 2012-05-20 MED ORDER — AMLODIPINE BESYLATE 10 MG PO TABS
10.0000 mg | ORAL_TABLET | Freq: Every morning | ORAL | Status: DC
  Administered 2012-05-20 – 2012-05-23 (×4): 10 mg via ORAL
  Filled 2012-05-20 (×4): qty 1

## 2012-05-20 NOTE — H&P (Signed)
[facility] History and Physical  Ian Sellers ZOX:[id number] DOB: Aug 12, 1934 DOA: 05/20/2012  Referring physician: ED physician PCP: Nadean Corwin, MD   Chief Complaint: Syncopal events at home  HPI:  Pt is 78 yo male with history of dementia, seizures, HTN, who presented to [facility] ED after an episode of fall at home. Pt is poor historian and unable to provide details of the events, he is unable to tell me how often this happens and the exact symptoms he is experiencing prior or post the event. His wife is at bedside and and is also unable to provide clear details of this last event as she says she was too tired and only recalls finding her husband on the floor unable to get up but awake and able to talk to her. Pt denies chest pain or shortness of breath at this time, no specific focal neurological symptoms, no specific pain.   In ED, pt feels well, Sodium level 124, TSH asked to admit for observations and evaluation of syncope.  Assessment and Plan:  Principal Problem:   SYNCOPE - this could be multifactorial in etiology and secondary to hyponatremia, orthostatic hypotension (pt is on doxazosin which could contribute as well), failure to thrive - will admit to telemetry floor, check TSH, 12 lead EKG, lower the dose of Carbamazepine and consider lowering dose of Doxazosin  - PT evaluation - check orthostatic vitals Active Problems:   HYPERTENSION - continue amlodipine for now and will add hydralazine as needed - readjust the regimen as needed   Hyponatremia - possibly related to Carbamazepine - will lower the dose and will notify PCP of change as we may consider changing AED (antiepileptic drug) regimen  - pt has received 1 L NS since admission, I will hold off on IVF as pt has elevated BNP and mild crackles on exam - repeat BMP in AM   SEIZURES, HX OF - this is stable for now - lower the dose of Carbamazepine and consider changing to another AED if PCP is in agreement   Dementia - appears to be stable and at pt's baseline  Code Status: Full Family Communication: Pt and at bedside Disposition Plan: PT evaluation, admit to telemetry floor   Review of Systems:  Constitutional: Negative for fever, chills and malaise/fatigue. Negative for diaphoresis.  HENT: Negative for hearing loss, ear pain, nosebleeds, congestion, sore throat, neck pain, tinnitus and ear discharge.   Eyes: Negative for blurred vision, double vision, photophobia, pain, discharge and redness.  Respiratory: Negative for cough, hemoptysis, sputum production, shortness of breath, wheezing and stridor.   Cardiovascular: Negative for chest pain, palpitations, orthopnea, claudication and leg swelling.  Gastrointestinal: Negative for nausea, vomiting and abdominal pain. Negative for heartburn, constipation, blood in stool and melena.  Genitourinary: Negative for dysuria, urgency, frequency, hematuria and flank pain.  Musculoskeletal: Negative for myalgias, back pain, joint pain..  Skin: Negative for itching and rash.  Neurological: Negative for tingling, tremors, sensory change, speech change, focal weakness, loss of consciousness and headaches.  Endo/Heme/Allergies: Negative for environmental allergies and polydipsia. Does not bruise/bleed easily.  Psychiatric/Behavioral: Negative for suicidal ideas. The patient is not nervous/anxious.     Past Medical History  Diagnosis Date  . Intracerebral hemorrhage   . Bradycardia   . Personal history of other disorders of nervous system and sense organs     seizure   . Syncope and collapse   . Cerebral aneurysm, nonruptured   . HTN (hypertension)     Past Surgical  History  Procedure Laterality Date  . Colonoscopy      x2. one w/hot biopsy. one w/ polypectomy  . Cerebral aneurysm repair      s/p surgery  . Ventriculoperitoneal shunt     Social History:  reports that he has been smoking.  He does not have any smokeless tobacco history on file. He  reports that he does not drink alcohol or use illicit drugs.  Allergies  Allergen Reactions  . Sulfa Antibiotics Other (See Comments)    Childhood allergy; reaction unknown   No specific medical problems in family members.  Medication Sig  amLODipine (NORVASC) 10 MG tablet Take 10 mg by mouth every morning.   Calcium Carbonate-Vitamin D ( Take 1 tablet by mouth every morning.   carbamazepine  300 MG 12 hr capsule Take 600 mg by mouth 3 (three) times daily.  Cholecalciferol 1000 UNITS tablet Take 2,000 Units by mouth every morning.   doxazosin (CARDURA) 4 MG tablet Take 2 mg by mouth 2 (two) times daily.    Physical Exam: Filed Vitals:   05/20/12 0111 05/20/12 0112 05/20/12 0700  BP: 170/72    Pulse: 62  64  Temp: 97.8 F (36.6 C)    TempSrc: Oral    Resp: 20  21  Height:  5\' 7"  (1.702 m)   Weight:  65.772 kg (145 lb)   SpO2: 100%  96%    Physical Exam  Constitutional: Appears well-developed and well-nourished. No distress.  HENT: Normocephalic. External right and left ear normal. Oropharynx is clear and moist.  Eyes: Conjunctivae and EOM are normal. PERRLA, no scleral icterus.  Neck: Normal ROM. Neck supple. No JVD. No tracheal deviation. No thyromegaly.  CVS: RRR, S1/S2 +, no murmurs, no gallops, no carotid bruit.  Pulmonary: Effort and breath sounds normal, no stridor, bibasilar crackles   Abdominal: Soft. BS +,  no distension, tenderness, rebound or guarding.  Musculoskeletal: Normal range of motion. No edema and no tenderness.  Lymphadenopathy: No lymphadenopathy noted, cervical, inguinal. Neuro: Alert. Normal reflexes, muscle tone coordination. No cranial nerve deficit. Skin: Skin is warm and dry. No rash noted. Not diaphoretic. No erythema. No pallor.  Psychiatric: Normal mood and affect. Behavior, judgment, thought content normal.   Labs on Admission:  Basic Metabolic Panel:  Recent Labs Lab 05/20/12 0154  NA 124*  K 4.0  CL 88*  CO2 25  GLUCOSE 112*  BUN  17  CREATININE 0.87  CALCIUM 8.6   Liver Function Tests:  Recent Labs Lab 05/20/12 0154  AST 14  ALT 16  ALKPHOS 82  BILITOT 0.2*  PROT 5.7*  ALBUMIN 3.2*   CBC:  Recent Labs Lab 05/20/12 0154  WBC 6.2  HGB 12.2*  HCT 33.4*  MCV 90.5  PLT 235   Cardiac Enzymes:  Recent Labs Lab 05/20/12 0600  TROPONINI <0.30    Radiological Exams on Admission: Ct Head Wo Contrast  05/20/2012   *RADIOLOGY REPORT*  Clinical Data: Loss of consciousness.  CT HEAD WITHOUT CONTRAST  Technique:  Contiguous axial images were obtained from the base of the skull through the vertex without contrast.  Comparison: 11/01/2011  Findings: The patient is status post left frontoparietal craniectomy.  There is a right frontal burr hole with ventriculostomy catheter in place.  Left frontal lobe encephalomalacia is similar to previous exam. There is diffuse patchy low density throughout the subcortical and periventricular white matter consistent with chronic small vessel ischemic change. There is prominence of the sulci and ventricles consistent  with brain atrophy.  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  There is no evidence for acute brain infarct, hemorrhage or mass.  The mastoid air cells are clear.  The paranasal sinuses are clear.  IMPRESSION:  1.  No acute intracranial abnormalities identified. 2.  Stable encephalomalacia involving the left frontal lobe and left cerebellar hemisphere. 3.  Ventriculostomy catheter appears in place.  No evidence for hydrocephalus.   Original Report Authenticated By: Signa Kell, M.D.   Dg Chest Portable 1 View  05/20/2012   *RADIOLOGY REPORT*  Clinical Data: Loss of consciousness.  Hypertension.  Bradycardia.  PORTABLE CHEST - 1 VIEW  Comparison: 04/01/2010  Findings: Stable appearance of right sided shunt tubing projecting over the chest.  Mild cardiomegaly noted with left ventricular prominence, increased from prior  chest radiograph from 2012.  Atherosclerotic aortic arch noted.  Indistinct pulmonary vasculature noted without overt edema.  No pleural effusion identified.  IMPRESSION:  1.  Mild cardiomegaly with left ventricular predominance, with pulmonary venous hypertension.  No overt edema.   Original Report Authenticated By: Gaylyn Rong, M.D.   EKG: Normal sinus rhythm, no ST/T wave changes  Debbora Presto, MD  [facility] Pager [phone]  If 7PM-7AM, please contact night-coverage [email or web address] Password [facility] 05/20/2012, 7:33 AM

## 2012-05-20 NOTE — ED Provider Notes (Signed)
History     CSN: [id number]  Arrival date & time 05/20/12  0100   First MD Initiated Contact with Patient 05/20/12 0102      Chief Complaint  Patient presents with  . Loss of Consciousness    (Consider location/radiation/quality/duration/timing/severity/associated sxs/prior treatment) HPI Hx per PT -  BIB EMS for syncope PTA - he recalls going into the closet for something and then woke up to paramedics. His wife called 911 when she found him down. He denies any pain or complaints in the ER, has h/o ICH, bradycardia, and syncope. No new medications. PT is a poor historian, denies any trouble with speech or trouble thinking.  Past Medical History  Diagnosis Date  . Intracerebral hemorrhage   . Bradycardia   . Personal history of other disorders of nervous system and sense organs     seizure   . Syncope and collapse   . Cerebral aneurysm, nonruptured   . HTN (hypertension)     Past Surgical History  Procedure Laterality Date  . Colonoscopy      x2. one w/hot biopsy. one w/ polypectomy  . Cerebral aneurysm repair      s/p surgery  . Ventriculoperitoneal shunt      History reviewed. No pertinent family history.  History  Substance Use Topics  . Smoking status: Current Every Deangelo Smoker -- 1.00 packs/Staver  . Smokeless tobacco: Not on file  . Alcohol Use: No      Review of Systems  Constitutional: Negative for fever and chills.  HENT: Negative for neck pain and neck stiffness.   Eyes: Negative for pain.  Respiratory: Negative for shortness of breath.   Cardiovascular: Negative for chest pain.  Gastrointestinal: Negative for vomiting and abdominal pain.  Genitourinary: Negative for dysuria.  Musculoskeletal: Negative for back pain.  Skin: Negative for rash and wound.  Neurological: Positive for syncope. Negative for headaches.  All other systems reviewed and are negative.    Allergies  Sulfa antibiotics and Sulfonamide derivatives  Home Medications   Current  Outpatient Rx  Name  Route  Sig  Dispense  Refill  . amLODipine (NORVASC) 10 MG tablet   Oral   Take 10 mg by mouth daily.           . Calcium Carbonate-Vitamin D (CALCIUM 500/D) 500-125 MG-UNIT TABS   Oral   Take 1 tablet by mouth daily.           . carbamazepine (CARBATROL) 300 MG 12 hr capsule   Oral   Take 600 mg by mouth 2 (two) times daily.           . Cholecalciferol (VITAMIN D3) 1000 UNITS tablet   Oral   Take 3,000 Units by mouth 2 (two) times daily.          . clonazePAM (KLONOPIN) 1 MG tablet   Oral   Take 1 mg by mouth 3 (three) times daily as needed. For anxiety         . docusate sodium (COLACE) 100 MG capsule   Oral   Take 100 mg by mouth 2 (two) times daily as needed. For constipation         . doxazosin (CARDURA) 4 MG tablet   Oral   Take 4 mg by mouth daily.           . enalapril (VASOTEC) 20 MG tablet   Oral   Take 20 mg by mouth daily.           Marland Kitchen  Multiple Vitamin (MULTIVITAMIN) tablet   Oral   Take 1 tablet by mouth daily.             BP 170/72  Pulse 62  Temp(Src) 97.8 F (36.6 C) (Oral)  Resp 20  Ht 5\' 7"  (1.702 m)  Wt 145 lb (65.772 kg)  BMI 22.71 kg/m2  SpO2 100%  Physical Exam  Constitutional: He is oriented to person, place, and time. He appears well-developed and well-nourished.  HENT:  Head: Normocephalic and atraumatic.  Mouth/Throat: Oropharynx is clear and moist. No oropharyngeal exudate.  Eyes: EOM are normal. Pupils are equal, round, and reactive to light. No scleral icterus.  Neck: Neck supple.  Cardiovascular: Normal rate, regular rhythm and intact distal pulses.   HR 60s  Pulmonary/Chest: Effort normal. No stridor. No respiratory distress.  Abdominal: Soft. Bowel sounds are normal. He exhibits no distension. There is no tenderness. There is no rebound and no guarding.  Musculoskeletal: Normal range of motion. He exhibits no edema.  Neurological: He is alert and oriented to person, place, and time. He  displays normal reflexes. No cranial nerve deficit. Coordination normal.  Skin: Skin is warm and dry.    ED Course  Procedures (including critical care time)  Results for orders placed during the hospital encounter of 05/20/12  CBC      Result Value Range   WBC 6.2  4.0 - 10.5 K/uL   RBC 3.69 (*) 4.22 - 5.81 MIL/uL   Hemoglobin 12.2 (*) 13.0 - 17.0 g/dL   HCT 75.6 (*) 43.3 - 29.5 %   MCV 90.5  78.0 - 100.0 fL   MCH 33.1  26.0 - 34.0 pg   MCHC 36.5 (*) 30.0 - 36.0 g/dL   RDW 18.8  41.6 - 60.6 %   Platelets 235  150 - 400 K/uL  URINALYSIS, ROUTINE W REFLEX MICROSCOPIC      Result Value Range   Color, Urine YELLOW  YELLOW   APPearance CLEAR  CLEAR   Specific Gravity, Urine 1.020  1.005 - 1.030   pH 6.5  5.0 - 8.0   Glucose, UA NEGATIVE  NEGATIVE mg/dL   Hgb urine dipstick NEGATIVE  NEGATIVE   Bilirubin Urine NEGATIVE  NEGATIVE   Ketones, ur NEGATIVE  NEGATIVE mg/dL   Protein, ur NEGATIVE  NEGATIVE mg/dL   Urobilinogen, UA 0.2  0.0 - 1.0 mg/dL   Nitrite NEGATIVE  NEGATIVE   Leukocytes, UA NEGATIVE  NEGATIVE  COMPREHENSIVE METABOLIC PANEL      Result Value Range   Sodium 124 (*) 135 - 145 mEq/L   Potassium 4.0  3.5 - 5.1 mEq/L   Chloride 88 (*) 96 - 112 mEq/L   CO2 25  19 - 32 mEq/L   Glucose, Bld 112 (*) 70 - 99 mg/dL   BUN 17  6 - 23 mg/dL   Creatinine, Ser 3.01  0.50 - 1.35 mg/dL   Calcium 8.6  8.4 - 60.1 mg/dL   Total Protein 5.7 (*) 6.0 - 8.3 g/dL   Albumin 3.2 (*) 3.5 - 5.2 g/dL   AST 14  0 - 37 U/L   ALT 16  0 - 53 U/L   Alkaline Phosphatase 82  39 - 117 U/L   Total Bilirubin 0.2 (*) 0.3 - 1.2 mg/dL   GFR calc non Af Amer 82 (*) >90 mL/min   GFR calc Af Amer >90  >90 mL/min  POCT I-STAT TROPONIN I      Result Value Range  Troponin i, poc 0.00  0.00 - 0.08 ng/mL   Comment 3           POCT I-STAT TROPONIN I      Result Value Range   Troponin i, poc 0.14 (*) 0.00 - 0.08 ng/mL   Comment NOTIFIED PHYSICIAN     Comment 3            Ct Head Wo  Contrast  05/20/2012   *RADIOLOGY REPORT*  Clinical Data: Loss of consciousness.  CT HEAD WITHOUT CONTRAST  Technique:  Contiguous axial images were obtained from the base of the skull through the vertex without contrast.  Comparison: 11/01/2011  Findings: The patient is status post left frontoparietal craniectomy.  There is a right frontal burr hole with ventriculostomy catheter in place.  Left frontal lobe encephalomalacia is similar to previous exam. There is diffuse patchy low density throughout the subcortical and periventricular white matter consistent with chronic small vessel ischemic change. There is prominence of the sulci and ventricles consistent with brain atrophy.  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  There is no evidence for acute brain infarct, hemorrhage or mass.  The mastoid air cells are clear.  The paranasal sinuses are clear.  IMPRESSION:  1.  No acute intracranial abnormalities identified. 2.  Stable encephalomalacia involving the left frontal lobe and left cerebellar hemisphere. 3.  Ventriculostomy catheter appears in place.  No evidence for hydrocephalus.   Original Report Authenticated By: Signa Kell, M.D.   Dg Chest Portable 1 View  05/20/2012   *RADIOLOGY REPORT*  Clinical Data: Loss of consciousness.  Hypertension.  Bradycardia.  PORTABLE CHEST - 1 VIEW  Comparison: 04/01/2010  Findings: Stable appearance of right sided shunt tubing projecting over the chest.  Mild cardiomegaly noted with left ventricular prominence, increased from prior chest radiograph from 2012.  Atherosclerotic aortic arch noted.  Indistinct pulmonary vasculature noted without overt edema.  No pleural effusion identified.  IMPRESSION:  1.  Mild cardiomegaly with left ventricular predominance, with pulmonary venous hypertension.  No overt edema.   Original Report Authenticated By: Gaylyn Rong, M.D.    Date: 05/20/2012  Rate: 59  Rhythm: normal  sinus rhythm  QRS Axis: normal  Intervals: normal  ST/T Wave abnormalities: nonspecific ST changes  Conduction Disutrbances:none  Narrative Interpretation:   Old EKG Reviewed: unchanged  IV fluid bolus provided. Cardiac monitoring.   5:51 AM CAR consulted. Case discussed as above with Dr Terressa Koyanagi. He recommends serum troponin and would not recommend transfer to [facility] - that cardiology could consult as needed at [facility].   6am- d/w Dr Julian Reil for admit Syncope, elevated troponin, pending serum troponin, repeat ECG unchanged   MDM  Syncope at home  ECG. CXR. CT. labs     Sunnie Nielsen, MD 05/20/12 [phone]

## 2012-05-20 NOTE — ED Notes (Signed)
[id number]<XB> Expected date:<BR> Expected time:<BR> Means of arrival:<BR> Comments:<BR> EMS/78 yo male found unconscious by wife-now awake

## 2012-05-20 NOTE — ED Notes (Signed)
Pt from home. Wife found him on floor unconscious.  Pt aroused with stimuli.  Vitals WNL in route.  Pt has no c/o pain.  Has no memory of incident.  Vitals 132/68, hr 60, resp 18, cbg 132, gcs 15, pain 0.  Hx dementia, brain aneurysm

## 2012-05-21 DIAGNOSIS — I10 Essential (primary) hypertension: Secondary | ICD-10-CM

## 2012-05-21 DIAGNOSIS — E871 Hypo-osmolality and hyponatremia: Secondary | ICD-10-CM

## 2012-05-21 LAB — BASIC METABOLIC PANEL
BUN: 12 mg/dL (ref 6–23)
CO2: 24 mEq/L (ref 19–32)
Calcium: 8.5 mg/dL (ref 8.4–10.5)
Creatinine, Ser: 0.72 mg/dL (ref 0.50–1.35)
GFR calc non Af Amer: 88 mL/min — ABNORMAL LOW (ref >90)
Glucose, Bld: 116 mg/dL — ABNORMAL HIGH (ref 70–99)

## 2012-05-21 LAB — CBC
MCH: 33.5 pg (ref 26.0–34.0)
MCHC: 36.7 g/dL — ABNORMAL HIGH (ref 30.0–36.0)
MCV: 91.4 fL (ref 78.0–100.0)
Platelets: 226 10*3/uL (ref 150–400)
RDW: 12.1 % (ref 11.5–15.5)

## 2012-05-21 MED ORDER — CARBAMAZEPINE ER 200 MG PO TB12
200.0000 mg | ORAL_TABLET | ORAL | Status: DC
  Administered 2012-05-21 – 2012-05-22 (×4): 200 mg via ORAL
  Filled 2012-05-21 (×9): qty 1

## 2012-05-21 MED ORDER — ALFUZOSIN HCL ER 10 MG PO TB24
10.0000 mg | ORAL_TABLET | Freq: Every day | ORAL | Status: DC
  Administered 2012-05-22 – 2012-05-23 (×2): 10 mg via ORAL
  Filled 2012-05-21 (×3): qty 1

## 2012-05-21 MED ORDER — ISOSORBIDE DINITRATE 30 MG PO TABS: 30.0000 mg | ORAL_TABLET | Freq: Every day | ORAL | Status: DC

## 2012-05-21 MED ORDER — ADULT MULTIVITAMIN W/MINERALS CH
1.0000 | ORAL_TABLET | Freq: Every day | ORAL | Status: DC
  Administered 2012-05-21 – 2012-05-23 (×3): 1 via ORAL
  Filled 2012-05-21 (×3): qty 1

## 2012-05-21 MED ORDER — ONE-DAILY MULTI VITAMINS PO TABS: 1.0000 | ORAL_TABLET | Freq: Every morning | ORAL | Status: DC

## 2012-05-21 MED ORDER — CALCIUM CARBONATE-VITAMIN D 500-125 MG-UNIT PO TABS: 1.0000 | ORAL_TABLET | Freq: Every morning | ORAL | Status: DC

## 2012-05-21 MED ORDER — VITAMIN D3 25 MCG (1000 UNIT) PO TABS
2000.0000 [IU] | ORAL_TABLET | Freq: Every morning | ORAL | Status: DC
  Administered 2012-05-21 – 2012-05-23 (×3): 2000 [IU] via ORAL
  Filled 2012-05-21 (×3): qty 2

## 2012-05-21 MED ORDER — PSYLLIUM 95 % PO PACK
1.0000 | PACK | Freq: Every morning | ORAL | Status: DC
  Administered 2012-05-21 – 2012-05-23 (×3): 1 via ORAL
  Filled 2012-05-21 (×3): qty 1

## 2012-05-21 MED ORDER — CARBAMAZEPINE ER 200 MG PO TB12
200.0000 mg | ORAL_TABLET | Freq: Once | ORAL | Status: AC
  Administered 2012-05-21: 200 mg via ORAL
  Filled 2012-05-21: qty 1

## 2012-05-21 MED ORDER — ISOSORBIDE MONONITRATE ER 30 MG PO TB24
30.0000 mg | ORAL_TABLET | Freq: Every day | ORAL | Status: DC
  Administered 2012-05-21 – 2012-05-22 (×2): 30 mg via ORAL
  Filled 2012-05-21 (×3): qty 1

## 2012-05-21 MED ORDER — IRBESARTAN 75 MG PO TABS
75.0000 mg | ORAL_TABLET | Freq: Every day | ORAL | Status: DC
  Administered 2012-05-21 – 2012-05-23 (×3): 75 mg via ORAL
  Filled 2012-05-21 (×3): qty 1

## 2012-05-21 MED ORDER — BISOPROLOL FUMARATE 5 MG PO TABS
5.0000 mg | ORAL_TABLET | Freq: Every day | ORAL | Status: DC
  Administered 2012-05-21 – 2012-05-23 (×3): 5 mg via ORAL
  Filled 2012-05-21 (×3): qty 1

## 2012-05-21 MED ORDER — CALCIUM CARBONATE-VITAMIN D 500-200 MG-UNIT PO TABS
1.0000 | ORAL_TABLET | Freq: Every day | ORAL | Status: DC
  Administered 2012-05-21 – 2012-05-23 (×3): 1 via ORAL
  Filled 2012-05-21 (×3): qty 1

## 2012-05-21 MED ORDER — QUETIAPINE FUMARATE 25 MG PO TABS
25.0000 mg | ORAL_TABLET | Freq: Every day | ORAL | Status: DC
  Administered 2012-05-21 – 2012-05-22 (×2): 25 mg via ORAL
  Filled 2012-05-21 (×3): qty 1

## 2012-05-21 MED ORDER — ASPIRIN 81 MG PO CHEW
81.0000 mg | CHEWABLE_TABLET | Freq: Every morning | ORAL | Status: DC
  Administered 2012-05-21 – 2012-05-23 (×3): 81 mg via ORAL
  Filled 2012-05-21 (×3): qty 1

## 2012-05-21 MED ORDER — CARBAMAZEPINE ER 200 MG PO TB12
400.0000 mg | ORAL_TABLET | Freq: Every day | ORAL | Status: DC
  Administered 2012-05-22 – 2012-05-23 (×2): 400 mg via ORAL
  Filled 2012-05-21 (×3): qty 2

## 2012-05-21 NOTE — Evaluation (Signed)
Physical Therapy Evaluation Patient Details Name: Ian Sellers Dillion MRN: [id number] DOB: 09-29-1934 Today's Date: 05/21/2012 Time: 5621-3086 PT Time Calculation (min): 30 min  PT Assessment / Plan / Recommendation Clinical Impression  78 y.o. male with h/o dementia admitted after unwitnessed fall at home. Assessed orthostatic BP: 152/67 supine, 181/86 Sitting, 161/78 standing. Pt denied dizziness. Pt ambulated 200' with RW independently, no LOB. Pt appears to be at baseline with mobility. No futher PT needs. Will sign off.     PT Assessment  Patent does not need any further PT services    Follow Up Recommendations  No PT follow up    Does the patient have the potential to tolerate intense rehabilitation      Barriers to Discharge        Equipment Recommendations  None recommended by PT    Recommendations for Other Services     Frequency      Precautions / Restrictions     Pertinent Vitals/Pain *BP supine 152/67, sitting 181/86, standing 161/78 HR 83, SaO2 100% on RA Pt asymptomatic 0/10 pain**      Mobility  Bed Mobility Bed Mobility: Supine to Sit Supine to Sit: 6: Modified independent (Device/Increase time);With rails Transfers Transfers: Sit to Stand;Stand to Sit Sit to Stand: From bed;6: Modified independent (Device/Increase time);With upper extremity assist Stand to Sit: To chair/3-in-1;6: Modified independent (Device/Increase time);With upper extremity assist Ambulation/Gait Ambulation/Gait Assistance: 6: Modified independent (Device/Increase time) Ambulation Distance (Feet): 200 Feet Assistive device: Rolling walker Gait Pattern: Wide base of support;Decreased step length - left;Decreased step length - right Gait velocity: WFL General Gait Details: per wife pt walks with decreased step length at baseline    Exercises     PT Diagnosis:    PT Problem List:   PT Treatment Interventions:     PT Goals    Visit Information  Last PT Received On:  05/21/12 Assistance Needed: +1    Subjective Data  Subjective: I use a cane at home.  Patient Stated Goal: none stated   Prior Functioning  Home Living Lives With: Spouse Available Help at Discharge: Available PRN/intermittently (wife works, pt goes to adult daycare) Type of Home: House Home Access: Stairs to enter Secretary/administrator of Steps: 3 Entrance Stairs-Rails: Can reach both;Left;Right Home Layout: One level Home Adaptive Equipment: Straight cane;Shower chair with back;Walker - rolling Additional Comments: uses RW at daycare, cane at home Prior Function Level of Independence: Needs assistance Needs Assistance: Bathing;Dressing Bath: Supervision/set-up Dressing: Minimal Comments: assist to get into shower and for set up; assist to get feet into pants Communication Communication: No difficulties    Cognition  Cognition Arousal/Alertness: Awake/alert Behavior During Therapy: WFL for tasks assessed/performed Overall Cognitive Status: History of cognitive impairments - at baseline    Extremity/Trunk Assessment Right Upper Extremity Assessment RUE ROM/Strength/Tone: [facility] for tasks assessed Left Upper Extremity Assessment LUE ROM/Strength/Tone: WFL for tasks assessed Right Lower Extremity Assessment RLE ROM/Strength/Tone: Within functional levels RLE Sensation: WFL - Light Touch RLE Coordination: WFL - gross/fine motor Left Lower Extremity Assessment LLE ROM/Strength/Tone: Within functional levels LLE Sensation: WFL - Light Touch LLE Coordination: WFL - gross/fine motor Trunk Assessment Trunk Assessment: Normal   Balance Balance Balance Assessed: Yes Static Sitting Balance Static Sitting - Balance Support: Bilateral upper extremity supported;Feet supported Static Sitting - Level of Assistance: 6: Modified independent (Device/Increase time) Static Sitting - Comment/# of Minutes: 3  End of Session PT - End of Session Equipment Utilized During Treatment: Gait  belt Activity Tolerance: Patient  tolerated treatment well Patient left: in chair;with call bell/phone within reach;with family/visitor present Nurse Communication: Mobility status  GP     Ralene Bathe Kistler 05/21/2012, 11:02 AM [phone]

## 2012-05-21 NOTE — Progress Notes (Addendum)
[facility] PROGRESS NOTE  Ian Sellers UJW:[id number] DOB: 10-31-1934 DOA: 05/20/2012 PCP: Nadean Corwin, MD  Brief narrative Pt is 78 yo male with history of dementia, seizures, HTN, who presented to [facility] ED on 5/17 after an episode of fall at home. Pt is poor historian and was unable to provide details of the events. His wife was also unable to provide clear details of this last event as she says she was too tired and only recalls finding her husband on the floor unable to get up but awake and able to talk to her. Hospitalist admission requested.  Assessment/Plan: 1. Syncope: Per EDP notes, patient recalled going into the closet for something and then woke up the paramedics. Patient has history of recurrent syncope. Etiology unclear. Orthostatic blood pressures on 5/18 negative. Patient denies complaints. No seizure-like activity reported during this episode. CT head negative. Ventriculostomy appears to be in place. 2. Hyponatremia: Could be multifactorial secondary to meds such as HCTZ and Tegretol XR. Tegretol dose was apparently recently reduced as an outpatient-Will thereby leave on PTA dose to avoid precipitating seizures. Will DC HCTZ indefinitely. Improved slightly compared to yesterday. Follow BMP in a.m. 3. Hypertension:  Mildly uncontrolled. Continue home medications which may need to be titrated. 4. History of seizures: Keep carbamazepine dose at home dose. Monitor. 5. Advanced dementia: Mental status may be at baseline now.  Code Status: Full  Family Communication:  no family at bedside.  Disposition Plan:  home when medically stable.    Consultants:   None  Procedures:   None  Antibiotics:   None    HPI/Subjective:  Patient denies complaints. Has no recollection of events.  Objective: Filed Vitals:   05/21/12 1007 05/21/12 1040 05/21/12 1042 05/21/12 1044  BP: 167/67 152/67 181/86 161/78  Pulse:      Temp:      TempSrc:      Resp:      Height:       Weight:      SpO2:       temperature 90.43F, pulse 76 per minute, respirations 16 per minute and saturations 99%.  Intake/Output Summary (Last 24 hours) at 05/21/12 1314 Last data filed at 05/21/12 0950  Gross per 24 hour  Intake    243 ml  Output   1840 ml  Net  -1597 ml   Filed Weights   05/20/12 0112  Weight: 65.772 kg (145 lb)    Exam:   General exam: Comfortable.  Respiratory system: Clear. No increased work of breathing.  Cardiovascular system: S1 & S2 heard, RRR. No JVD, murmurs, gallops, clicks or pedal edema. Telemetry: Sinus rhythm with first degree AV block.  Gastrointestinal system: Abdomen is nondistended, soft and nontender. Normal bowel sounds heard.  Central nervous system: Alert and oriented x2. No focal neurological deficits.  Extremities: Symmetric 5 x 5 power.   Data Reviewed: Basic Metabolic Panel:  Recent Labs Lab 05/20/12 0154 05/21/12 0545  NA 124* 127*  K 4.0 3.8  CL 88* 95*  CO2 25 24  GLUCOSE 112* 116*  BUN 17 12  CREATININE 0.87 0.72  CALCIUM 8.6 8.5   Liver Function Tests:  Recent Labs Lab 05/20/12 0154  AST 14  ALT 16  ALKPHOS 82  BILITOT 0.2*  PROT 5.7*  ALBUMIN 3.2*   No results found for this basename: LIPASE, AMYLASE,  in the last 168 hours No results found for this basename: AMMONIA,  in the last 168 hours CBC:  Recent Labs Lab  05/20/12 0154 05/21/12 0545  WBC 6.2 5.2  HGB 12.2* 12.4*  HCT 33.4* 33.8*  MCV 90.5 91.4  PLT 235 226   Cardiac Enzymes:  Recent Labs Lab 05/20/12 0600  TROPONINI <0.30   BNP (last 3 results)  Recent Labs  05/20/12 0929  PROBNP 827.7*   CBG: No results found for this basename: GLUCAP,  in the last 168 hours  No results found for this or any previous visit (from the past 240 hour(s)).   Studies: Ct Head Wo Contrast  05/20/2012   *RADIOLOGY REPORT*  Clinical Data: Loss of consciousness.  CT HEAD WITHOUT CONTRAST  Technique:  Contiguous axial images were obtained  from the base of the skull through the vertex without contrast.  Comparison: 11/01/2011  Findings: The patient is status post left frontoparietal craniectomy.  There is a right frontal burr hole with ventriculostomy catheter in place.  Left frontal lobe encephalomalacia is similar to previous exam. There is diffuse patchy low density throughout the subcortical and periventricular white matter consistent with chronic small vessel ischemic change. There is prominence of the sulci and ventricles consistent with brain atrophy.  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  There is no evidence for acute brain infarct, hemorrhage or mass.  The mastoid air cells are clear.  The paranasal sinuses are clear.  IMPRESSION:  1.  No acute intracranial abnormalities identified. 2.  Stable encephalomalacia involving the left frontal lobe and left cerebellar hemisphere. 3.  Ventriculostomy catheter appears in place.  No evidence for hydrocephalus.   Original Report Authenticated By: Signa Kell, M.D.   Dg Chest Portable 1 View  05/20/2012   *RADIOLOGY REPORT*  Clinical Data: Loss of consciousness.  Hypertension.  Bradycardia.  PORTABLE CHEST - 1 VIEW  Comparison: 04/01/2010  Findings: Stable appearance of right sided shunt tubing projecting over the chest.  Mild cardiomegaly noted with left ventricular prominence, increased from prior chest radiograph from 2012.  Atherosclerotic aortic arch noted.  Indistinct pulmonary vasculature noted without overt edema.  No pleural effusion identified.  IMPRESSION:  1.  Mild cardiomegaly with left ventricular predominance, with pulmonary venous hypertension.  No overt edema.   Original Report Authenticated By: Gaylyn Rong, M.D.     Additional labs:   Scheduled Meds: . amLODipine  10 mg Oral q morning - 10a  . carbamazepine  200 mg Oral TID  . doxazosin  2 mg Oral BID  . enoxaparin (LOVENOX) injection  40 mg Subcutaneous Q24H   . sodium chloride  3 mL Intravenous Q12H   Continuous Infusions:   Principal Problem:   SYNCOPE Active Problems:   HYPERTENSION   SEIZURES, HX OF   Dementia   Hyponatremia    Time spent: 30 minutes    [facility]  [facility] Pager [phone].   If 8PM-8AM, please contact night-coverage at [email or web address], password [facility] 05/21/2012, 1:14 PM  LOS: 1 [place]

## 2012-05-21 NOTE — Care Management Note (Addendum)
    Page 1 of 1   05/23/2012     5:04:23 PM   CARE MANAGEMENT NOTE 05/23/2012  Patient:  Ian Sellers, Ian Sellers   Account Number:  [id number]  Date Initiated:  05/21/2012  Documentation initiated by:  Lanier Clam  Subjective/Objective Assessment:   ADMITTED W/SYNCOPE     Action/Plan:   FROM HOME W/SPOUSE.HAS PCP,PHARMACY.HAS CANE,RW.   Anticipated DC Date:  05/23/2012   Anticipated DC Plan:  HOME/SELF CARE      DC Planning Services  CM consult      Choice offered to / List presented to:             Status of service:  Completed, signed off Medicare Important Message given?   (If response is "NO", the following Medicare IM given date fields will be blank) Date Medicare IM given:   Date Additional Medicare IM given:    Discharge Disposition:  HOME/SELF CARE  Per UR Regulation:  Reviewed for med. necessity/level of care/duration of stay  If discussed at Long Length of Stay Meetings, dates discussed:    Comments:  05/21/12 Ian Welp RN,BSN NCM WEEKEND [phone] PT-NO F/U.NO ANTICIPATED D/C NEEDS.

## 2012-05-22 LAB — BASIC METABOLIC PANEL
BUN: 20 mg/dL (ref 6–23)
Calcium: 8.2 mg/dL — ABNORMAL LOW (ref 8.4–10.5)
Chloride: 92 mEq/L — ABNORMAL LOW (ref 96–112)
Creatinine, Ser: 0.83 mg/dL (ref 0.50–1.35)
GFR calc Af Amer: >90 mL/min (ref >90)
GFR calc non Af Amer: 83 mL/min — ABNORMAL LOW (ref >90)

## 2012-05-22 LAB — OSMOLALITY: Osmolality: 268 mOsm/kg — ABNORMAL LOW (ref 275–300)

## 2012-05-22 NOTE — Progress Notes (Signed)
[facility] PROGRESS NOTE  Ian Sellers Askin ZOX:[id number] DOB: [birth year] DOA: 05/20/2012 PCP: Nadean Corwin, MD  Brief narrative Pt is 78 yo male with history of dementia, seizures, HTN, who presented to [facility] ED on 5/17 after an episode of fall at home. Pt is poor historian and was unable to provide details of the events. His wife was also unable to provide clear details of this last event as she says she was too tired and only recalls finding her husband on the floor unable to get up but awake and able to talk to her. Hospitalist admission requested.  Assessment/Plan: 1. Syncope: Per EDP notes, patient recalled going into the closet for something and then woke up to the paramedics. Patient has history of recurrent syncope. Etiology unclear. Orthostatic blood pressures on 5/18 negative. Patient denies complaints. No seizure-like activity reported during this episode. CT head negative. Ventriculostomy appears to be in place. 2. Hyponatremia: Could be multifactorial secondary to meds such as HCTZ and Tegretol XR. Tegretol dose was apparently recently reduced as an outpatient-Will thereby leave on PTA dose to avoid precipitating seizures. Will DC HCTZ indefinitely. May take days to improve/stablize. Clinically euvolemic. ? SIADH- fluid restrict and follow BMP in a.m. Check serum and urine osmolarity. 3. Hypertension:  Mildly uncontrolled. Continue home medications which may need to be titrated. 4. History of seizures: Keep carbamazepine dose at home dose. Monitor. 5. Advanced dementia: Mental status may be at baseline now.  Code Status: Full  Family Communication:  no family at bedside.  Disposition Plan:  home when medically stable-? 5/20.    Consultants:   None  Procedures:   None  Antibiotics:   None    HPI/Subjective:  Patient denies complaints. Has no recollection of events. Per nursing, no acute events  Objective: Filed Vitals:   05/21/12 2146 05/22/12 0643 05/22/12  0825 05/22/12 1003  BP: 204/64 178/64 149/61 158/64  Pulse: 59 64 79   Temp: 98.1 F (36.7 C) 97.6 F (36.4 C)    TempSrc: Oral Oral    Resp: 20 18    Height:      Weight:  66.7 kg (147 lb 0.8 oz)    SpO2: 100% 98%      Intake/Output Summary (Last 24 hours) at 05/22/12 1019 Last data filed at 05/22/12 1009  Gross per 24 hour  Intake    940 ml  Output    641 ml  Net    299 ml   Filed Weights   05/20/12 0112 05/22/12 0643  Weight: 65.772 kg (145 lb) 66.7 kg (147 lb 0.8 oz)    Exam:   General exam: Pleasant and comfortable.  Respiratory system: Clear. No increased work of breathing.  Cardiovascular system: S1 & S2 heard, RRR. No JVD, murmurs, gallops, clicks or pedal edema. Telemetry: Sinus bradycardia in the 50s-sinus rhythm.  Gastrointestinal system: Abdomen is nondistended, soft and nontender. Normal bowel sounds heard.  Central nervous system: Alert and oriented x2. No focal neurological deficits.  Extremities: Symmetric 5 x 5 power.   Data Reviewed: Basic Metabolic Panel:  Recent Labs Lab 05/20/12 0154 05/21/12 0545 05/22/12 0500  NA 124* 127* 125*  K 4.0 3.8 3.9  CL 88* 95* 92*  CO2 25 24 24   GLUCOSE 112* 116* 102*  BUN 17 12 20   CREATININE 0.87 0.72 0.83  CALCIUM 8.6 8.5 8.2*   Liver Function Tests:  Recent Labs Lab 05/20/12 0154  AST 14  ALT 16  ALKPHOS 82  BILITOT 0.2*  PROT 5.7*  ALBUMIN 3.2*   No results found for this basename: LIPASE, AMYLASE,  in the last 168 hours No results found for this basename: AMMONIA,  in the last 168 hours CBC:  Recent Labs Lab 05/20/12 0154 05/21/12 0545  WBC 6.2 5.2  HGB 12.2* 12.4*  HCT 33.4* 33.8*  MCV 90.5 91.4  PLT 235 226   Cardiac Enzymes:  Recent Labs Lab 05/20/12 0600  TROPONINI <0.30   BNP (last 3 results)  Recent Labs  05/20/12 0929  PROBNP 827.7*   CBG: No results found for this basename: GLUCAP,  in the last 168 hours  No results found for this or any previous visit  (from the past 240 hour(s)).   Studies: No results found.   Additional labs:   Scheduled Meds: . alfuzosin  10 mg Oral Q breakfast  . amLODipine  10 mg Oral q morning - 10a  . aspirin  81 mg Oral q morning - 10a  . bisoprolol  5 mg Oral Daily  . calcium-vitamin D  1 tablet Oral Daily  . carbamazepine  200 mg Oral Custom  . carbamazepine  400 mg Oral Q breakfast  . cholecalciferol  2,000 Units Oral q morning - 10a  . doxazosin  2 mg Oral BID  . enoxaparin (LOVENOX) injection  40 mg Subcutaneous Q24H  . irbesartan  75 mg Oral Daily  . isosorbide mononitrate  30 mg Oral QHS  . multivitamin with minerals  1 tablet Oral Daily  . psyllium  1 packet Oral q morning - 10a  . QUEtiapine  25 mg Oral QHS  . sodium chloride  3 mL Intravenous Q12H   Continuous Infusions:   Principal Problem:   SYNCOPE Active Problems:   HYPERTENSION   SEIZURES, HX OF   Dementia   Hyponatremia    Time spent: 30 minutes    [facility]  [facility] Pager [phone].   If 8PM-8AM, please contact night-coverage at [email or web address], password [facility] 05/22/2012, 10:19 AM  LOS: 2 days

## 2012-05-23 DIAGNOSIS — F0390 Unspecified dementia without behavioral disturbance: Secondary | ICD-10-CM

## 2012-05-23 LAB — BASIC METABOLIC PANEL
BUN: 20 mg/dL (ref 6–23)
Calcium: 8.2 mg/dL — ABNORMAL LOW (ref 8.4–10.5)
Creatinine, Ser: 0.93 mg/dL (ref 0.50–1.35)
GFR calc Af Amer: >90 mL/min (ref >90)
GFR calc non Af Amer: 80 mL/min — ABNORMAL LOW (ref >90)
Glucose, Bld: 99 mg/dL (ref 70–99)

## 2012-05-23 MED ORDER — VALSARTAN 80 MG PO TABS: 80.0000 mg | ORAL_TABLET | Freq: Every day | ORAL | Status: DC

## 2012-05-23 NOTE — Discharge Summary (Signed)
Physician Discharge Summary  Ian Sellers HYQ:[id number] DOB: 1934-11-10 DOA: 05/20/2012  PCP: Everlean Cherry, MD  Admit date: 05/20/2012 Discharge date: 05/23/2012  Time spent: Less than 30 minutes  Recommendations for Outpatient Follow-up:  1. Dr. Arlan Organ, PCP in [place], [place], in 3 days with repat labs (BMP) 2. Consider OP further evaluation of syncope if not recently done and if indicated. 3. Consider stopping one of two alpha blockers as deemed necessary.  Discharge Diagnoses:  Principal Problem:   SYNCOPE Active Problems:   HYPERTENSION   SEIZURES, HX OF   Dementia   Hyponatremia   Discharge Condition: Improved & Stable  Diet recommendation: Heart healthy diet.  Filed Weights   05/20/12 0112 05/22/12 0643 05/23/12 0420  Weight: 65.772 kg (145 lb) 66.7 kg (147 lb 0.8 oz) 67.1 kg (147 lb 14.9 oz)    History of present illness:  Pt is 78 yo male with history of dementia, seizures, HTN, who presented to [facility] ED on 5/17 after an episode of fall at home. Pt is poor historian and was unable to provide details of the events. His wife was also unable to provide clear details of this last event. They were sitting and watching TV in the living room and fell asleep on the recliner. When she woke up at approximately midnight, spouse was not in his chair. She found him in the laundry room, sitting propped up to the washer and a cigarette in his mouth. She was unable to arouse him and called 911. Hospitalist admission requested.  Hospital Course:  1. Syncope: Per EDP notes, patient recalled going into the closet for something and then woke up to the paramedics. Patient has long history of recurrent syncope. He has been followed by [facility] and the syncope is attributed to "neurally medicated syncope". Orthostatic blood pressures on 5/18 negative. Patient denies complaints. No seizure-like activity reported during this episode. CT head negative. Ventriculostomy appears to be in place. 2 D  Echo 2008: LVEF 60-65%. No obvious carotid bruit and telemetry does not reveal obvious etiology. Not sure of any recent OP work up and evaluation done at PCP's and hence will defer further evaluation and follow up to OP setting with PCP.  2. Hyponatremia/SIADH: Could be multifactorial secondary to meds such as HCTZ and Tegretol XR. Not sure if Tegretol dose was recently reduced as an outpatient-Will thereby leave on PTA dose to avoid precipitating seizures. Per spouse no known recent seizure like activity. Will DC HCTZ indefinitely. May take days to improve/stablize. Clinically euvolemic. Serum and urine osmolarity results as below. Sodium has improved. Follow up BMP as OP in few days.. 3. Hypertension: Mildly uncontrolled. Continue home medications which may need to be titrated. Also she is on 2 different alpha blockers and may consider DC one as OP. 4. History of seizures: Keep carbamazepine dose at home dose. Monitor. 5. Advanced dementia: Mental status at baseline now, as discussed with spouse..  Procedures:  None    Consultations:  None  Discharge Exam:  Complaints: Was somnolent this morning but subsequently alert and coherant.   Filed Vitals:   05/23/12 0944 05/23/12 0948 05/23/12 0952 05/23/12 1054  BP: 157/54 164/58 146/114 169/65  Pulse: 59 64 123   Temp:      TempSrc:      Resp:      Height:      Weight:      SpO2:         General exam: Pleasant and comfortable.  Respiratory system: Clear. No increased work of breathing.   Cardiovascular system: S1 & S2 heard, RRR. No JVD, murmurs, gallops, clicks or pedal edema. Telemetry: Sinus bradycardia in the 50s-sinus rhythm.   Gastrointestinal system: Abdomen is nondistended, soft and nontender. Normal bowel sounds heard.   Central nervous system: Alert and oriented x2. No focal neurological deficits.   Extremities: Symmetric 5 x 5 power   Discharge Instructions      Discharge Orders   Future Orders Complete By  Expires     Call MD for:  extreme fatigue  As directed     Call MD for:  persistant dizziness or light-headedness  As directed     Call MD for:  As directed     Comments:      Passing out episodes.    Diet - low sodium heart healthy  As directed     Comments:      Fluid restriction: 1200 mls per [place].    Discharge instructions  As directed     Comments:      No driving. Avoid heights, standing water, dangerous equipment or machinery or any situation that could cause physical harm if you were to have a seizure.    Increase activity slowly  As directed         Medication List    STOP taking these medications       valsartan-hydrochlorothiazide 80-12.5 MG per tablet  Commonly known as:  DIOVAN-HCT      TAKE these medications       alfuzosin 10 MG 24 hr tablet  Commonly known as:  UROXATRAL  Take 10 mg by mouth daily with breakfast.     amLODipine 10 MG tablet  Commonly known as:  NORVASC  Take 10 mg by mouth every morning.     aspirin 81 MG chewable tablet  Chew 81 mg by mouth every morning.     bisoprolol 5 MG tablet  Commonly known as:  ZEBETA  Take 5 mg by mouth daily.     Calcium 500/D 500-125 MG-UNIT Tabs  Generic drug:  Calcium Carbonate-Vitamin D  Take 1 tablet by mouth every morning.     carbamazepine 200 MG 12 hr tablet  Commonly known as:  TEGRETOL XR  Take 200-400 mg by mouth 3 (three) times daily. take 400 mg in the morning, 200 mg at dinnertime and 200 mg at bedtime     cholecalciferol 1000 UNITS tablet  Commonly known as:  VITAMIN D  Take 2,000 Units by mouth every morning.     doxazosin 4 MG tablet  Commonly known as:  CARDURA  Take 2 mg by mouth 2 (two) times daily.     isosorbide dinitrate 30 MG tablet  Commonly known as:  ISORDIL  Take 30 mg by mouth at bedtime.     multivitamin tablet  Take 1 tablet by mouth every morning.     psyllium 95 % Pack  Commonly known as:  HYDROCIL/METAMUCIL  Take 1 packet by mouth every morning.      QUEtiapine 25 MG tablet  Commonly known as:  SEROQUEL  Take 25 mg by mouth at bedtime.     valsartan 80 MG tablet  Commonly known as:  DIOVAN  Take 1 tablet (80 mg total) by mouth daily.       Follow-up Information   Follow up with Everlean Cherry, MD. Schedule an appointment as soon as possible for a visit in 3 days. (To be seen with repeat labs (  BMP))        The results of significant diagnostics from this hospitalization (including imaging, microbiology, ancillary and laboratory) are listed below for reference.    Significant Diagnostic Studies: Ct Head Wo Contrast  05/20/2012   *RADIOLOGY REPORT*  Clinical Data: Loss of consciousness.  CT HEAD WITHOUT CONTRAST  Technique:  Contiguous axial images were obtained from the base of the skull through the vertex without contrast.  Comparison: 11/01/2011  Findings: The patient is status post left frontoparietal craniectomy.  There is a right frontal burr hole with ventriculostomy catheter in place.  Left frontal lobe encephalomalacia is similar to previous exam. There is diffuse patchy low density throughout the subcortical and periventricular white matter consistent with chronic small vessel ischemic change. There is prominence of the sulci and ventricles consistent with brain atrophy.  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  There is no evidence for acute brain infarct, hemorrhage or mass.  The mastoid air cells are clear.  The paranasal sinuses are clear.  IMPRESSION:  1.  No acute intracranial abnormalities identified. 2.  Stable encephalomalacia involving the left frontal lobe and left cerebellar hemisphere. 3.  Ventriculostomy catheter appears in place.  No evidence for hydrocephalus.   Original Report Authenticated By: Signa Kell, M.D.   Dg Chest Portable 1 View  05/20/2012   *RADIOLOGY REPORT*  Clinical Data: Loss of consciousness.  Hypertension.  Bradycardia.  PORTABLE CHEST - 1 VIEW   Comparison: 04/01/2010  Findings: Stable appearance of right sided shunt tubing projecting over the chest.  Mild cardiomegaly noted with left ventricular prominence, increased from prior chest radiograph from 2012.  Atherosclerotic aortic arch noted.  Indistinct pulmonary vasculature noted without overt edema.  No pleural effusion identified.  IMPRESSION:  1.  Mild cardiomegaly with left ventricular predominance, with pulmonary venous hypertension.  No overt edema.   Original Report Authenticated By: Gaylyn Rong, M.D.    Microbiology: No results found for this or any previous visit (from the past 240 hour(s)).   Labs: Basic Metabolic Panel:  Recent Labs Lab 05/20/12 0154 05/21/12 0545 05/22/12 0500 05/23/12 0515  NA 124* 127* 125* 131*  K 4.0 3.8 3.9 4.1  CL 88* 95* 92* 97  CO2 25 24 24 26   GLUCOSE 112* 116* 102* 99  BUN 17 12 20 20   CREATININE 0.87 0.72 0.83 0.93  CALCIUM 8.6 8.5 8.2* 8.2*   Liver Function Tests:  Recent Labs Lab 05/20/12 0154  AST 14  ALT 16  ALKPHOS 82  BILITOT 0.2*  PROT 5.7*  ALBUMIN 3.2*   No results found for this basename: LIPASE, AMYLASE,  in the last 168 hours No results found for this basename: AMMONIA,  in the last 168 hours CBC:  Recent Labs Lab 05/20/12 0154 05/21/12 0545  WBC 6.2 5.2  HGB 12.2* 12.4*  HCT 33.4* 33.8*  MCV 90.5 91.4  PLT 235 226   Cardiac Enzymes:  Recent Labs Lab 05/20/12 0600  TROPONINI <0.30   BNP: BNP (last 3 results)  Recent Labs  05/20/12 0929  PROBNP 827.7*   CBG: No results found for this basename: GLUCAP,  in the last 168 hours  Additional labs:  Serum Osmolarity: 268  Urine Osmolarity: 415  TSH: 0.937   Signed:  Mekel Haverstock  [facility] 05/23/2012, 1:04 PM

## 2012-05-23 NOTE — Progress Notes (Signed)
Stopped to see patient today. Family member was present, but the TV was so loud I was not able to catch her name. She relates that patient goes to [facility] [place] [facility] regularly. He was dozing as I was talking to the family member, but awoke and I was able to introduce myself and say "Hi". Otherwise, patient was not alert enough and had some dementia that made it difficult to carry on a conversation with him. Family member mentioned that he may be discharged today. Family member works full-time and then comes to care for patient and his wife when she is finished at work. She relates that it is difficult and is appreciative of any help she can get.

## 2012-10-04 ENCOUNTER — Emergency Department (HOSPITAL_COMMUNITY): Payer: Medicare Other

## 2012-10-04 ENCOUNTER — Emergency Department (HOSPITAL_COMMUNITY)
Admission: EM | Admit: 2012-10-04 | Discharge: 2012-10-04 | Disposition: A | Discharge status: Skilled Nursing Facility | Payer: Medicare Other | Attending: Emergency Medicine | Admitting: Emergency Medicine

## 2012-10-04 DIAGNOSIS — Z7982 Long term (current) use of aspirin: Secondary | ICD-10-CM | POA: Insufficient documentation

## 2012-10-04 DIAGNOSIS — F0390 Unspecified dementia without behavioral disturbance: Secondary | ICD-10-CM | POA: Insufficient documentation

## 2012-10-04 DIAGNOSIS — F17200 Nicotine dependence, unspecified, uncomplicated: Secondary | ICD-10-CM | POA: Insufficient documentation

## 2012-10-04 DIAGNOSIS — G40909 Epilepsy, unspecified, not intractable, without status epilepticus: Secondary | ICD-10-CM | POA: Insufficient documentation

## 2012-10-04 DIAGNOSIS — I10 Essential (primary) hypertension: Secondary | ICD-10-CM | POA: Insufficient documentation

## 2012-10-04 DIAGNOSIS — Z79899 Other long term (current) drug therapy: Secondary | ICD-10-CM | POA: Insufficient documentation

## 2012-10-04 LAB — COMPREHENSIVE METABOLIC PANEL
ALT: 18 U/L (ref 0–53)
AST: 15 U/L (ref 0–37)
Albumin: 3.6 g/dL (ref 3.5–5.2)
Alkaline Phosphatase: 179 U/L — ABNORMAL HIGH (ref 39–117)
CO2: 25 mEq/L (ref 19–32)
Calcium: 8.7 mg/dL (ref 8.4–10.5)
Creatinine, Ser: 1.03 mg/dL (ref 0.50–1.35)
GFR calc Af Amer: 79 mL/min — ABNORMAL LOW (ref >90)
GFR calc non Af Amer: 68 mL/min — ABNORMAL LOW (ref >90)
Glucose, Bld: 104 mg/dL — ABNORMAL HIGH (ref 70–99)
Potassium: 3.9 mEq/L (ref 3.5–5.1)
Sodium: 135 mEq/L (ref 135–145)
Total Bilirubin: 0.3 mg/dL (ref 0.3–1.2)
Total Protein: 6.2 g/dL (ref 6.0–8.3)

## 2012-10-04 LAB — CBC WITH DIFFERENTIAL/PLATELET
Basophils Absolute: 0 10*3/uL (ref 0.0–0.1)
Eosinophils Absolute: 0 10*3/uL (ref 0.0–0.7)
Eosinophils Relative: 1 % (ref 0–5)
HCT: 34.8 % — ABNORMAL LOW (ref 39.0–52.0)
Lymphocytes Relative: 27 % (ref 12–46)
MCH: 33.2 pg (ref 26.0–34.0)
MCV: 91.8 fL (ref 78.0–100.0)
Monocytes Absolute: 0.5 10*3/uL (ref 0.1–1.0)
Platelets: 236 10*3/uL (ref 150–400)
RDW: 12.2 % (ref 11.5–15.5)

## 2012-10-04 LAB — URINALYSIS, ROUTINE W REFLEX MICROSCOPIC
Bilirubin Urine: NEGATIVE
Glucose, UA: NEGATIVE mg/dL
Hgb urine dipstick: NEGATIVE
Ketones, ur: NEGATIVE mg/dL
Protein, ur: NEGATIVE mg/dL
Urobilinogen, UA: 0.2 mg/dL (ref 0.0–1.0)

## 2012-10-04 LAB — AMMONIA: Ammonia: 27 umol/L (ref 11–60)

## 2012-10-04 LAB — CARBAMAZEPINE LEVEL, TOTAL: Carbamazepine Lvl: 10.6 ug/mL (ref 4.0–12.0)

## 2012-10-04 NOTE — Consult Note (Signed)
[facility] History and Physical  Ian Sellers ZOX:[id number] DOB: 07/11/1934    PCP:   Everlean Cherry, MD   Chief Complaint: altered mental status. Reason for consultation:  Whether to admit Ian Sellers. Requesting MD:  Dr Manus Gunning.  HPI: Ian Sellers is an 78 y.o. male with hx of Dementia, Dx about a year ago, though symptoms were longer, Hx of intracerebral hemorrhage, s/p VP shunt many years ago, HTN, went from rehab to a new nursing home about 2 weeks ago, brought in for being more agitated, more difficult to manage, and increase emotional lability.  Evaluation in the ER included normal serology, negative CT scan of the head for any acute process.  He was started on Exalon patch recently as well.  He has seizure history and has been on Tegretal with therapeutic level.    Rewiew of Systems: He has no HA, chest pain, chills, shortness of breath.   Past Medical History  Diagnosis Date  . Intracerebral hemorrhage   . Bradycardia   . Personal history of other disorders of nervous system and sense organs     seizure   . Syncope and collapse   . Cerebral aneurysm, nonruptured   . HTN (hypertension)   . Dementia     Past Surgical History  Procedure Laterality Date  . Colonoscopy      x2. one w/hot biopsy. one w/ polypectomy  . Cerebral aneurysm repair      s/p surgery  . Ventriculoperitoneal shunt      Medications:  HOME MEDS: Prior to Admission medications   Medication Sig Start Date End Date Taking? Authorizing Provider  alfuzosin (UROXATRAL) 10 MG 24 hr tablet Take 10 mg by mouth daily with breakfast.   Yes Historical Provider, MD  amLODipine (NORVASC) 10 MG tablet Take 10 mg by mouth every morning.    Yes Historical Provider, MD  aspirin 81 MG chewable tablet Chew 81 mg by mouth every morning.   Yes Historical Provider, MD  calcitonin, salmon, (MIACALCIN/FORTICAL) 200 UNIT/ACT nasal spray Place 1 spray into the nose daily. For 6 weeks alternating nostrils. Start date:  09/28/12 End date:11/09/12   Yes Historical Provider, MD  Calcium Carbonate-Vitamin D (CALCIUM 600 + D PO) Take 1 tablet by mouth daily.   Yes Historical Provider, MD  carbamazepine (TEGRETOL XR) 400 MG 12 hr tablet Take 200-400 mg by mouth 2 (two) times daily. 400 mg every morning and 200 mg at noon   Yes Historical Provider, MD  carbamazepine (TEGRETOL) 200 MG tablet Take 200 mg by mouth at bedtime.   Yes Historical Provider, MD  Cholecalciferol (VITAMIN D3) 2000 UNITS TABS Take 2,000 Units by mouth 2 (two) times daily.   Yes Historical Provider, MD  doxazosin (CARDURA) 2 MG tablet Take 2 mg by mouth 2 (two) times daily.   Yes Historical Provider, MD  furosemide (LASIX) 20 MG tablet Take 20 mg by mouth daily.   Yes Historical Provider, MD  isosorbide mononitrate (IMDUR) 30 MG 24 hr tablet Take 30 mg by mouth daily.   Yes Historical Provider, MD  lidocaine (LIDODERM) 5 % Place 3 patches onto the skin daily. Applied to lower back. Remove & Discard patch within 12 hours or as directed by MD   Yes Historical Provider, MD  loratadine (CLARITIN) 10 MG tablet Take 10 mg by mouth daily.   Yes Historical Provider, MD  mometasone (NASONEX) 50 MCG/ACT nasal spray Place 2 sprays into the nose 2 (two) times daily. Being used  for only 10 days. Stop date 10/05/12   Yes Historical Provider, MD  Multiple Vitamin (MULTIVITAMIN) tablet Take 1 tablet by mouth every morning.    Yes Historical Provider, MD  oxyCODONE (OXY IR/ROXICODONE) 5 MG immediate release tablet Take 2.5 mg by mouth every 4 (four) hours as needed for pain.   Yes Historical Provider, MD  polyethylene glycol (MIRALAX / GLYCOLAX) packet Take 17 g by mouth daily.   Yes Historical Provider, MD  QUEtiapine (SEROQUEL) 25 MG tablet Take 25 mg by mouth at bedtime.   Yes Historical Provider, MD  valsartan (DIOVAN) 160 MG tablet Take 160 mg by mouth 2 (two) times daily.   Yes Historical Provider, MD  zonisamide (ZONEGRAN) 100 MG capsule Take 100 mg by mouth 2  (two) times daily.   Yes Historical Provider, MD     Allergies:  Allergies  Allergen Reactions  . Sulfa Antibiotics Other (See Comments)    Childhood allergy; reaction unknown    Social History:   reports that he has been smoking.  He does not have any smokeless tobacco history on file. He reports that he does not drink alcohol or use illicit drugs.  Family History: No family history on file.   Physical Exam: Filed Vitals:   10/04/12 1441 10/04/12 1652 10/04/12 1854  BP: 142/76 184/69 193/74  Pulse: 72  77  Temp: 98 F (36.7 C) 98.7 F (37.1 C) 98.1 F (36.7 C)  TempSrc: Oral  Oral  Resp:  20 25  SpO2: 100% 100% 99%   Blood pressure 193/74, pulse 77, temperature 98.1 F (36.7 C), temperature source Oral, resp. rate 25, SpO2 99.00%.  GEN:  Pleasant  patient lying in the stretcher in no acute distress; cooperative with exam. Emotional. PSYCH:  alert and oriented x4; does not appear anxious or depressed; affect is appropriate. HEENT: Mucous membranes pink and anicteric; PERRLA; EOM intact; no cervical lymphadenopathy nor thyromegaly or carotid bruit; no JVD; There were no stridor. Neck is very supple. Breasts:: Not examined CHEST WALL: No tenderness CHEST: Normal respiration, clear to auscultation bilaterally.  HEART: Regular rate and rhythm.  There are no murmur, rub, or gallops.   BACK: No kyphosis or scoliosis; no CVA tenderness ABDOMEN: soft and non-tender; no masses, no organomegaly, normal abdominal bowel sounds; no pannus; no intertriginous candida. There is no rebound and no distention. Rectal Exam: Not done EXTREMITIES: No bone or joint deformity; age-appropriate arthropathy of the hands and knees; no edema; no ulcerations.  There is no calf tenderness. Genitalia: not examined PULSES: 2+ and symmetric SKIN: Normal hydration no rash or ulceration CNS: Cranial nerves 2-12 grossly intact no focal lateralizing neurologic deficit.  Speech is fluent; uvula elevated  with phonation, facial symmetry and tongue midline. Right side slightly weaker.  Labs on Admission:  Basic Metabolic Panel:  Recent Labs Lab 10/04/12 1547  NA 135  K 3.9  CL 98  CO2 25  GLUCOSE 104*  BUN 25*  CREATININE 1.03  CALCIUM 8.7   Liver Function Tests:  Recent Labs Lab 10/04/12 1547  AST 15  ALT 18  ALKPHOS 179*  BILITOT 0.3  PROT 6.2  ALBUMIN 3.6   No results found for this basename: LIPASE, AMYLASE,  in the last 168 hours  Recent Labs Lab 10/04/12 1547  AMMONIA 27   CBC:  Recent Labs Lab 10/04/12 1547  WBC 4.4  NEUTROABS 2.7  HGB 12.6*  HCT 34.8*  MCV 91.8  PLT 236   Cardiac Enzymes:  Recent Labs  Lab 10/04/12 1547  TROPONINI <0.30    CBG: No results found for this basename: GLUCAP,  in the last 168 hours   Radiological Exams on Admission: Dg Chest 2 View  10/04/2012   CLINICAL DATA:  Altered mental status.  EXAM: CHEST  2 VIEW  COMPARISON:  Chest x-rays dated 05/20/2012 and 04/01/2010  FINDINGS: Heart size and vascularity are normal and the lungs are clear. There are multiple lower thoracic compression fractures, probably stable since 05/20/2012.  Extensive calcifications seen along the ventriculoperitoneal shunt tube. No effusions. Osteopenia.  IMPRESSION: No active cardiopulmonary disease. Multiple thoracic compression fractures, old.   Electronically Signed   By: Geanie Cooley   On: 10/04/2012 15:35   Ct Head Wo Contrast  10/04/2012   CLINICAL DATA:  Lethargy and hallucinations  EXAM: CT HEAD WITHOUT CONTRAST  TECHNIQUE: Contiguous axial images were obtained from the base of the skull through the vertex without intravenous contrast. Study was obtained within 24 hr of patient's arrival at the emergency department.  COMPARISON:  May 20, 2012  FINDINGS: There is a ventriculoperitoneal shunt catheter with the tip in the midline between the lateral ventricles. This positioning is stable. There is mild diffuse atrophy, unchanged. Patient is  status post left frontal and temporal craniotomy, stable.  There is no mass, hemorrhage, extra-axial fluid collection, or midline shift.  There is small vessel disease in the centra semiovale bilaterally, more on the left than on the right. There is evidence of a prior superomedial left frontal infarct, stable. There is evidence of a prior infarct in the left globus pallidum. There is a stable lacunar infarct involving the right internal capsule and medial right globus pallidum, stable. There is a prior infarct in the inferior left cerebellum, stable. No new gray-white compartment lesion is appreciable. No acute infarct seen.  Bony calvarium appears intact except for the post craniotomy defect on the left. Mastoid air cells are clear.  IMPRESSION: Postoperative change with shunt catheter in place, stable. Atrophy is stable. Multiple prior infarcts and periventricular small vessel disease, stable. No mass, hemorrhage, or acute appearing infarct.   Electronically Signed   By: Bretta Bang   On: 10/04/2012 15:53   Assessment/Plan Dementia with sundowning Agitation with new environment   PLAN: I think he is agitated with new environment as expected in patients with dementia.  I don't think he needs to be hospitalized which would actually make him worse. I discussed with family and they would like to have him sent back to the nursing home.  I suggest that they speak with the PCP about his medication and have congruent plan along with care expectation since he is still new at the facility.  They express gratefulness and agreement.  EDP aware and agreed as well.  Thank you for the consult.  Other plans as per orders.   Houston Siren, MD. [facility] Pager [phone] 7pm to 7am.  10/04/2012, 8:16 PM

## 2012-10-04 NOTE — ED Provider Notes (Signed)
CSN: [id number]     Arrival date & time 10/04/12  1436 History   First MD Initiated Contact with Patient 10/04/12 1456     Chief Complaint  Patient presents with  . Altered Mental Status   (Consider location/radiation/quality/duration/timing/severity/associated sxs/prior Treatment) HPI Comments: Patient from nursing home with a one-[place] history of altered mental status. Is a history of dementia with previous cerebral aneurysm repair in 1984. Facility and wife state patient has been more confused and repeating himself for the past Amend. He denies any fall. He has been complaining of pain in his back which is apparently chronic. Patient denies any head, chest, abdominal pain. Denies any focal weakness, numbness tingling. No falls. Wife states they have been adjusting his medications but she is not sure how they have been changed.  The history is provided by the patient and the spouse. The history is limited by the condition of the patient.    Past Medical History  Diagnosis Date  . Intracerebral hemorrhage   . Bradycardia   . Personal history of other disorders of nervous system and sense organs     seizure   . Syncope and collapse   . Cerebral aneurysm, nonruptured   . HTN (hypertension)   . Dementia    Past Surgical History  Procedure Laterality Date  . Colonoscopy      x2. one w/hot biopsy. one w/ polypectomy  . Cerebral aneurysm repair      s/p surgery  . Ventriculoperitoneal shunt     No family history on file. History  Substance Use Topics  . Smoking status: Current Every [place] Smoker -- 0.50 packs/[place]  . Smokeless tobacco: Not on file  . Alcohol Use: No    Review of Systems  Unable to perform ROS: Dementia    Allergies  Sulfa antibiotics  Home Medications   Current Outpatient Rx  Name  Route  Sig  Dispense  Refill  . alfuzosin (UROXATRAL) 10 MG 24 hr tablet   Oral   Take 10 mg by mouth daily with breakfast.         . amLODipine (NORVASC) 10 MG tablet    Oral   Take 10 mg by mouth every morning.          Marland Kitchen aspirin 81 MG chewable tablet   Oral   Chew 81 mg by mouth every morning.         . calcitonin, salmon, (MIACALCIN/FORTICAL) 200 UNIT/ACT nasal spray   Nasal   Place 1 spray into the nose daily. For 6 weeks alternating nostrils. Start date: 09/28/12 End date:11/09/12         . Calcium Carbonate-Vitamin D (CALCIUM 600 + D PO)   Oral   Take 1 tablet by mouth daily.         . carbamazepine (TEGRETOL XR) 400 MG 12 hr tablet   Oral   Take 200-400 mg by mouth 2 (two) times daily. 400 mg every morning and 200 mg at noon         . carbamazepine (TEGRETOL) 200 MG tablet   Oral   Take 200 mg by mouth at bedtime.         . Cholecalciferol (VITAMIN D3) 2000 UNITS TABS   Oral   Take 2,000 Units by mouth 2 (two) times daily.         Marland Kitchen doxazosin (CARDURA) 2 MG tablet   Oral   Take 2 mg by mouth 2 (two) times daily.         Marland Kitchen  furosemide (LASIX) 20 MG tablet   Oral   Take 20 mg by mouth daily.         . isosorbide mononitrate (IMDUR) 30 MG 24 hr tablet   Oral   Take 30 mg by mouth daily.         Marland Kitchen lidocaine (LIDODERM) 5 %   Transdermal   Place 3 patches onto the skin daily. Applied to lower back. Remove & Discard patch within 12 hours or as directed by MD         . loratadine (CLARITIN) 10 MG tablet   Oral   Take 10 mg by mouth daily.         . mometasone (NASONEX) 50 MCG/ACT nasal spray   Nasal   Place 2 sprays into the nose 2 (two) times daily. Being used for only 10 days. Stop date 10/05/12         . Multiple Vitamin (MULTIVITAMIN) tablet   Oral   Take 1 tablet by mouth every morning.          Marland Kitchen oxyCODONE (OXY IR/ROXICODONE) 5 MG immediate release tablet   Oral   Take 2.5 mg by mouth every 4 (four) hours as needed for pain.         . polyethylene glycol (MIRALAX / GLYCOLAX) packet   Oral   Take 17 g by mouth daily.         . QUEtiapine (SEROQUEL) 25 MG tablet   Oral   Take 25 mg by  mouth at bedtime.         . valsartan (DIOVAN) 160 MG tablet   Oral   Take 160 mg by mouth 2 (two) times daily.         Marland Kitchen zonisamide (ZONEGRAN) 100 MG capsule   Oral   Take 100 mg by mouth 2 (two) times daily.          BP 162/65  Pulse 66  Temp(Src) 98.1 F (36.7 C) (Oral)  Resp 16  SpO2 99% Physical Exam  Constitutional: He appears well-developed and well-nourished. No distress.  Oriented to self and location.  Disoriented to time.   HENT:  Head: Normocephalic and atraumatic.  Questionable L eye droop  Eyes: Conjunctivae and EOM are normal. Pupils are equal, round, and reactive to light.  Neck: Normal range of motion. Neck supple.  No meningismus  Cardiovascular: Normal rate, regular rhythm, normal heart sounds and intact distal pulses.   No murmur heard. Equal peripheral pulses  Pulmonary/Chest: Effort normal and breath sounds normal. No respiratory distress.  Abdominal: Soft. There is no tenderness. There is no rebound and no guarding.  Musculoskeletal: Normal range of motion. He exhibits no edema and no tenderness.  Neurological: He is alert. No cranial nerve deficit. He exhibits normal muscle tone. Coordination normal.  Moving all extremities, slightly weaker on R, chronic  Skin: Skin is warm.    ED Course  Procedures (including critical care time) Labs Review Labs Reviewed  URINALYSIS, ROUTINE W REFLEX MICROSCOPIC - Abnormal; Notable for the following:    APPearance CLOUDY (*)    pH 8.5 (*)    All other components within normal limits  CBC WITH DIFFERENTIAL - Abnormal; Notable for the following:    RBC 3.79 (*)    Hemoglobin 12.6 (*)    HCT 34.8 (*)    MCHC 36.2 (*)    All other components within normal limits  COMPREHENSIVE METABOLIC PANEL - Abnormal; Notable for the following:    Glucose,  Bld 104 (*)    BUN 25 (*)    Alkaline Phosphatase 179 (*)    GFR calc non Af Amer 68 (*)    GFR calc Af Amer 79 (*)    All other components within normal limits   TROPONIN I  AMMONIA  CARBAMAZEPINE LEVEL, TOTAL   Imaging Review Dg Chest 2 View  10/04/2012   CLINICAL DATA:  Altered mental status.  EXAM: CHEST  2 VIEW  COMPARISON:  Chest x-rays dated 05/20/2012 and 04/01/2010  FINDINGS: Heart size and vascularity are normal and the lungs are clear. There are multiple lower thoracic compression fractures, probably stable since 05/20/2012.  Extensive calcifications seen along the ventriculoperitoneal shunt tube. No effusions. Osteopenia.  IMPRESSION: No active cardiopulmonary disease. Multiple thoracic compression fractures, old.   Electronically Signed   By: Geanie Cooley   On: 10/04/2012 15:35   Ct Head Wo Contrast  10/04/2012   CLINICAL DATA:  Lethargy and hallucinations  EXAM: CT HEAD WITHOUT CONTRAST  TECHNIQUE: Contiguous axial images were obtained from the base of the skull through the vertex without intravenous contrast. Study was obtained within 24 hr of patient's arrival at the emergency department.  COMPARISON:  May 20, 2012  FINDINGS: There is a ventriculoperitoneal shunt catheter with the tip in the midline between the lateral ventricles. This positioning is stable. There is mild diffuse atrophy, unchanged. Patient is status post left frontal and temporal craniotomy, stable.  There is no mass, hemorrhage, extra-axial fluid collection, or midline shift.  There is small vessel disease in the centra semiovale bilaterally, more on the left than on the right. There is evidence of a prior superomedial left frontal infarct, stable. There is evidence of a prior infarct in the left globus pallidum. There is a stable lacunar infarct involving the right internal capsule and medial right globus pallidum, stable. There is a prior infarct in the inferior left cerebellum, stable. No new gray-white compartment lesion is appreciable. No acute infarct seen.  Bony calvarium appears intact except for the post craniotomy defect on the left. Mastoid air cells are clear.   IMPRESSION: Postoperative change with shunt catheter in place, stable. Atrophy is stable. Multiple prior infarcts and periventricular small vessel disease, stable. No mass, hemorrhage, or acute appearing infarct.   Electronically Signed   By: Bretta Bang   On: 10/04/2012 15:53    MDM   1. Dementia    Altered mental status for the past Aikman.  Hx dementia, poor historian, vitals stable.   CT head is stable. Chest x-ray is negative. Urinalysis is negative for infection. Labs appear to be within normal limits. Carbamazepine level therapeutic.  Family continues to state patient not at his baseline remains altered and seems like he is "drunk". Patient is able ambulatory with assistance which is his baseline.  He is tolerating PO.  It seems his confusion and emotional lability may be secondary to his dementia and medications.  Discussed with family the remote possibility of stroke not seen on CT. however patient does not appear to have any new focal deficits. If small infarct was appreciated, it would unlikely change patient's overall course. Family agrees. Dr. Conley Rolls of [facility] has provided a consult and agrees. Family patient is at his baseline and they are taking him home. He'll followup with his PCP regarding his medications.   Date: 10/04/2012  Rate: 67  Rhythm: normal sinus rhythm  QRS Axis: normal  Intervals: PR prolonged  ST/T Wave abnormalities: nonspecific ST/T changes  Conduction Disutrbances:first-degree A-V block   Narrative Interpretation: inferior lateral ST depressions unchanged  Old EKG Reviewed: unchanged    Ian Octave, MD 10/04/12 2050

## 2012-10-04 NOTE — ED Notes (Signed)
Pt arrives via [facility] from [facility]. Caregivers report over the last few days pt is more "lethargic" "hallucinating" and "needing more help transferring." Hx of dementia.

## 2014-04-01 ENCOUNTER — Encounter (HOSPITAL_COMMUNITY): Payer: Self-pay

## 2014-04-01 ENCOUNTER — Inpatient Hospital Stay (HOSPITAL_COMMUNITY)
Admission: EM | Admit: 2014-04-01 | Discharge: 2014-04-04 | DRG: 065 | Disposition: A | Discharge status: Hospice | Payer: Medicare Other | Attending: Internal Medicine | Admitting: Internal Medicine

## 2014-04-01 ENCOUNTER — Emergency Department (HOSPITAL_COMMUNITY): Payer: Medicare Other

## 2014-04-01 DIAGNOSIS — Z982 Presence of cerebrospinal fluid drainage device: Secondary | ICD-10-CM

## 2014-04-01 DIAGNOSIS — Z515 Encounter for palliative care: Secondary | ICD-10-CM | POA: Diagnosis not present

## 2014-04-01 DIAGNOSIS — E872 Acidosis, unspecified: Secondary | ICD-10-CM | POA: Diagnosis present

## 2014-04-01 DIAGNOSIS — Z993 Dependence on wheelchair: Secondary | ICD-10-CM | POA: Diagnosis not present

## 2014-04-01 DIAGNOSIS — R569 Unspecified convulsions: Secondary | ICD-10-CM | POA: Diagnosis present

## 2014-04-01 DIAGNOSIS — R0681 Apnea, not elsewhere classified: Secondary | ICD-10-CM | POA: Diagnosis not present

## 2014-04-01 DIAGNOSIS — R001 Bradycardia, unspecified: Secondary | ICD-10-CM | POA: Diagnosis present

## 2014-04-01 DIAGNOSIS — G4733 Obstructive sleep apnea (adult) (pediatric): Secondary | ICD-10-CM | POA: Diagnosis present

## 2014-04-01 DIAGNOSIS — Z7982 Long term (current) use of aspirin: Secondary | ICD-10-CM | POA: Diagnosis not present

## 2014-04-01 DIAGNOSIS — R1314 Dysphagia, pharyngoesophageal phase: Secondary | ICD-10-CM | POA: Insufficient documentation

## 2014-04-01 DIAGNOSIS — Z66 Do not resuscitate: Secondary | ICD-10-CM | POA: Diagnosis present

## 2014-04-01 DIAGNOSIS — E860 Dehydration: Secondary | ICD-10-CM | POA: Diagnosis present

## 2014-04-01 DIAGNOSIS — I639 Cerebral infarction, unspecified: Secondary | ICD-10-CM | POA: Diagnosis present

## 2014-04-01 DIAGNOSIS — I10 Essential (primary) hypertension: Secondary | ICD-10-CM | POA: Diagnosis present

## 2014-04-01 DIAGNOSIS — R7989 Other specified abnormal findings of blood chemistry: Secondary | ICD-10-CM | POA: Diagnosis present

## 2014-04-01 DIAGNOSIS — I619 Nontraumatic intracerebral hemorrhage, unspecified: Secondary | ICD-10-CM | POA: Diagnosis present

## 2014-04-01 DIAGNOSIS — F0390 Unspecified dementia without behavioral disturbance: Secondary | ICD-10-CM | POA: Diagnosis present

## 2014-04-01 DIAGNOSIS — R748 Abnormal levels of other serum enzymes: Secondary | ICD-10-CM | POA: Diagnosis not present

## 2014-04-01 DIAGNOSIS — Z79899 Other long term (current) drug therapy: Secondary | ICD-10-CM

## 2014-04-01 DIAGNOSIS — F17210 Nicotine dependence, cigarettes, uncomplicated: Secondary | ICD-10-CM | POA: Diagnosis present

## 2014-04-01 DIAGNOSIS — R410 Disorientation, unspecified: Secondary | ICD-10-CM

## 2014-04-01 DIAGNOSIS — I6350 Cerebral infarction due to unspecified occlusion or stenosis of unspecified cerebral artery: Secondary | ICD-10-CM | POA: Diagnosis not present

## 2014-04-01 DIAGNOSIS — Z882 Allergy status to sulfonamides status: Secondary | ICD-10-CM

## 2014-04-01 LAB — URINALYSIS, ROUTINE W REFLEX MICROSCOPIC
Bilirubin Urine: NEGATIVE
GLUCOSE, UA: NEGATIVE mg/dL
KETONES UR: NEGATIVE mg/dL
Nitrite: NEGATIVE
PH: 5.5 (ref 5.0–8.0)
Protein, ur: 30 mg/dL — AB
SPECIFIC GRAVITY, URINE: 1.02 (ref 1.005–1.030)
Urobilinogen, UA: 0.2 mg/dL (ref 0.0–1.0)

## 2014-04-01 LAB — COMPREHENSIVE METABOLIC PANEL
ALT: 52 U/L (ref 0–53)
ANION GAP: 10 (ref 5–15)
AST: 54 U/L — AB (ref 0–37)
Albumin: 3.2 g/dL — ABNORMAL LOW (ref 3.5–5.2)
Alkaline Phosphatase: 66 U/L (ref 39–117)
BUN: 36 mg/dL — ABNORMAL HIGH (ref 6–23)
CALCIUM: 8.9 mg/dL (ref 8.4–10.5)
CO2: 22 mmol/L (ref 19–32)
Chloride: 111 mmol/L (ref 96–112)
Creatinine, Ser: 1.37 mg/dL — ABNORMAL HIGH (ref 0.50–1.35)
GFR, EST AFRICAN AMERICAN: 56 mL/min — AB (ref >90)
GFR, EST NON AFRICAN AMERICAN: 48 mL/min — AB (ref >90)
GLUCOSE: 168 mg/dL — AB (ref 70–99)
Potassium: 4.1 mmol/L (ref 3.5–5.1)
Sodium: 143 mmol/L (ref 135–145)
Total Bilirubin: 0.5 mg/dL (ref 0.3–1.2)
Total Protein: 5.5 g/dL — ABNORMAL LOW (ref 6.0–8.3)

## 2014-04-01 LAB — CBC WITH DIFFERENTIAL/PLATELET
Basophils Absolute: 0 10*3/uL (ref 0.0–0.1)
Basophils Relative: 0 % (ref 0–1)
Eosinophils Absolute: 0 10*3/uL (ref 0.0–0.7)
Eosinophils Relative: 0 % (ref 0–5)
HEMATOCRIT: 34.2 % — AB (ref 39.0–52.0)
Hemoglobin: 11.6 g/dL — ABNORMAL LOW (ref 13.0–17.0)
Lymphocytes Relative: 4 % — ABNORMAL LOW (ref 12–46)
Lymphs Abs: 0.5 10*3/uL — ABNORMAL LOW (ref 0.7–4.0)
MCH: 31.4 pg (ref 26.0–34.0)
MCHC: 33.9 g/dL (ref 30.0–36.0)
MCV: 92.4 fL (ref 78.0–100.0)
MONO ABS: 0.7 10*3/uL (ref 0.1–1.0)
Monocytes Relative: 7 % (ref 3–12)
NEUTROS ABS: 9.3 10*3/uL — AB (ref 1.7–7.7)
Neutrophils Relative %: 89 % — ABNORMAL HIGH (ref 43–77)
PLATELETS: 207 10*3/uL (ref 150–400)
RBC: 3.7 MIL/uL — AB (ref 4.22–5.81)
RDW: 13.2 % (ref 11.5–15.5)
WBC: 10.4 10*3/uL (ref 4.0–10.5)

## 2014-04-01 LAB — CREATININE, SERUM
Creatinine, Ser: 1.1 mg/dL (ref 0.50–1.35)
GFR calc Af Amer: 73 mL/min — ABNORMAL LOW (ref >90)
GFR, EST NON AFRICAN AMERICAN: 63 mL/min — AB (ref >90)

## 2014-04-01 LAB — LACTIC ACID, PLASMA: LACTIC ACID, VENOUS: 1 mmol/L (ref 0.5–2.0)

## 2014-04-01 LAB — GLUCOSE, CAPILLARY
Glucose-Capillary: 106 mg/dL — ABNORMAL HIGH (ref 70–99)
Glucose-Capillary: 107 mg/dL — ABNORMAL HIGH (ref 70–99)

## 2014-04-01 LAB — URINE MICROSCOPIC-ADD ON

## 2014-04-01 LAB — CARBAMAZEPINE LEVEL, TOTAL: Carbamazepine Lvl: <2 ug/mL — ABNORMAL LOW (ref 4.0–12.0)

## 2014-04-01 LAB — POC OCCULT BLOOD, ED: Fecal Occult Bld: NEGATIVE

## 2014-04-01 LAB — I-STAT TROPONIN, ED: Troponin i, poc: 0.01 ng/mL (ref 0.00–0.08)

## 2014-04-01 LAB — I-STAT CG4 LACTIC ACID, ED: Lactic Acid, Venous: 2.78 mmol/L (ref 0.5–2.0)

## 2014-04-01 MED ORDER — HEPARIN SODIUM (PORCINE) 5000 UNIT/ML IJ SOLN
5000.0000 [IU] | Freq: Three times a day (TID) | INTRAMUSCULAR | Status: DC
  Administered 2014-04-01 – 2014-04-04 (×9): 5000 [IU] via SUBCUTANEOUS
  Filled 2014-04-01 (×8): qty 1

## 2014-04-01 MED ORDER — ASPIRIN 300 MG RE SUPP
300.0000 mg | Freq: Once | RECTAL | Status: AC
Start: 2014-04-01 — End: 2014-04-01
  Administered 2014-04-01: 300 mg via RECTAL
  Filled 2014-04-01: qty 1

## 2014-04-01 MED ORDER — SODIUM CHLORIDE 0.9 % IV SOLN
INTRAVENOUS | Status: AC
  Administered 2014-04-01 – 2014-04-02 (×2): via INTRAVENOUS

## 2014-04-01 MED ORDER — BISACODYL 10 MG RE SUPP
10.0000 mg | Freq: Once | RECTAL | Status: AC
  Administered 2014-04-01: 10 mg via RECTAL
  Filled 2014-04-01: qty 1

## 2014-04-01 MED ORDER — LEVETIRACETAM IN NACL 500 MG/100ML IV SOLN
500.0000 mg | Freq: Two times a day (BID) | INTRAVENOUS | Status: DC
  Administered 2014-04-01 – 2014-04-04 (×7): 500 mg via INTRAVENOUS
  Filled 2014-04-01 (×9): qty 100

## 2014-04-01 MED ORDER — ASPIRIN 325 MG PO TABS: 325.0000 mg | ORAL_TABLET | Freq: Every day | ORAL | Status: DC

## 2014-04-01 MED ORDER — BISACODYL 10 MG RE SUPP: 10.0000 mg | Freq: Every day | RECTAL | Status: DC | PRN

## 2014-04-01 MED ORDER — STROKE: EARLY STAGES OF RECOVERY BOOK
Freq: Once | Status: AC
  Administered 2014-04-01: 12:00:00

## 2014-04-01 MED ORDER — SODIUM CHLORIDE 0.9 % IV BOLUS (SEPSIS)
500.0000 mL | Freq: Once | INTRAVENOUS | Status: AC
  Administered 2014-04-01: 500 mL via INTRAVENOUS

## 2014-04-01 MED ORDER — HALOPERIDOL LACTATE 5 MG/ML IJ SOLN: 0.5000 mg | Freq: Two times a day (BID) | INTRAMUSCULAR | Status: DC | PRN

## 2014-04-01 MED ORDER — SODIUM CHLORIDE 0.9 % IV BOLUS (SEPSIS): 500.0000 mL | Freq: Once | INTRAVENOUS | Status: DC

## 2014-04-01 MED ORDER — HYDRALAZINE HCL 20 MG/ML IJ SOLN
10.0000 mg | Freq: Four times a day (QID) | INTRAMUSCULAR | Status: DC | PRN
  Administered 2014-04-01 – 2014-04-03 (×4): 10 mg via INTRAVENOUS
  Filled 2014-04-01 (×4): qty 1

## 2014-04-01 MED ORDER — ASPIRIN 300 MG RE SUPP
300.0000 mg | Freq: Every day | RECTAL | Status: DC
  Administered 2014-04-02 – 2014-04-04 (×3): 300 mg via RECTAL
  Filled 2014-04-01 (×4): qty 1

## 2014-04-01 NOTE — ED Notes (Signed)
Dr. zavitz at the bedside.  

## 2014-04-01 NOTE — Progress Notes (Signed)
Pt is admitted to room 4N27 from ED. Admission vitals sign are stable

## 2014-04-01 NOTE — ED Notes (Signed)
Pt monitored by pulse ox, bp cuff, and 12-lead. 

## 2014-04-01 NOTE — ED Notes (Signed)
Family at bedside. 

## 2014-04-01 NOTE — ED Notes (Signed)
Critical I-stat lactic acid reported to Dr Jodi MourningZavitz

## 2014-04-01 NOTE — ED Provider Notes (Signed)
CSN: [id number]     Arrival date & time 04/01/14  0712 History   First MD Initiated Contact with Patient 04/01/14 [phone]     Chief Complaint  Patient presents with  . Respiratory Distress     (Consider location/radiation/quality/duration/timing/severity/associated sxs/prior Treatment) HPI Comments: 80 year old male with high blood pressure, intracerebral hemorrhage/aneurysm and surgery years ago, dementia, nursing home presents after less responsive and apnea episode. Unknown details per family however per report patient was less responsive this morning and seemed is diffuse holding his breath, no color change witnessed no recent fevers or signs or symptoms different than usual. Patient had baseline now in the ER, was on nasal cannula however not requiring currently. Patient had baseline bedbound and requires significant assistance. Patient dementia baseline currently. No lung disease or heart disease known.  The history is provided by the patient, a relative, medical records and the nursing home.    Past Medical History  Diagnosis Date  . Intracerebral hemorrhage   . Bradycardia   . Personal history of other disorders of nervous system and sense organs     seizure   . Syncope and collapse   . Cerebral aneurysm, nonruptured   . HTN (hypertension)   . Dementia    Past Surgical History  Procedure Laterality Date  . Colonoscopy      x2. one w/hot biopsy. one w/ polypectomy  . Cerebral aneurysm repair      s/p surgery  . Ventriculoperitoneal shunt     No family history on file. History  Substance Use Topics  . Smoking status: Current Every [place] Smoker -- 0.50 packs/[place]  . Smokeless tobacco: Not on file  . Alcohol Use: No    Review of Systems  Unable to perform ROS: Dementia      Allergies  Sulfa antibiotics  Home Medications   Prior to Admission medications   Medication Sig Start Date End Date Taking? Authorizing Provider  alfuzosin (UROXATRAL) 10 MG 24 hr tablet Take  10 mg by mouth daily with breakfast.   Yes Historical Provider, MD  Amino Acids-Protein Hydrolys (FEEDING SUPPLEMENT, PRO-STAT SUGAR FREE 64,) LIQD Take 30 mLs by mouth 3 (three) times daily.   Yes Historical Provider, MD  amLODipine (NORVASC) 10 MG tablet Take 10 mg by mouth every morning.    Yes Historical Provider, MD  aspirin 81 MG chewable tablet Chew 81 mg by mouth every morning.   Yes Historical Provider, MD  Calcium Carbonate-Vitamin D (CALCIUM 600 + D PO) Take 1 tablet by mouth daily.   Yes Historical Provider, MD  Cholecalciferol (VITAMIN D3) 2000 UNITS TABS Take 2,000 Units by mouth 2 (two) times daily.   Yes Historical Provider, MD  diphenhydrAMINE (BENADRYL) 25 mg capsule Take 25 mg by mouth 2 (two) times daily as needed for itching.   Yes Historical Provider, MD  isosorbide mononitrate (IMDUR) 30 MG 24 hr tablet Take 30 mg by mouth daily.   Yes Historical Provider, MD  lamoTRIgine (LAMICTAL) 100 MG tablet Take 100 mg by mouth daily.   Yes Historical Provider, MD  levETIRAcetam (KEPPRA) 500 MG tablet Take 500 mg by mouth 2 (two) times daily.   Yes Historical Provider, MD  loratadine (CLARITIN) 10 MG tablet Take 10 mg by mouth daily.   Yes Historical Provider, MD  Multiple Vitamin (MULTIVITAMIN) tablet Take 1 tablet by mouth every morning.    Yes Historical Provider, MD  Nutritional Supplements (ENSURE PO) Take 1 Can by mouth 2 (two) times daily. With lunch and  supper   Yes Historical Provider, MD  oxyCODONE (OXY IR/ROXICODONE) 5 MG immediate release tablet Take 5 mg by mouth every 12 (twelve) hours.    Yes Historical Provider, MD  polyethylene glycol (MIRALAX / GLYCOLAX) packet Take 17 g by mouth daily.   Yes Historical Provider, MD  QUEtiapine (SEROQUEL) 50 MG tablet Take 50 mg by mouth at bedtime.   Yes Historical Provider, MD  trihexyphenidyl (ARTANE) 5 MG tablet Take 2.5 mg by mouth 2 (two) times daily.   Yes Historical Provider, MD  valsartan (DIOVAN) 160 MG tablet Take 160 mg by  mouth daily.    Yes Historical Provider, MD  zonisamide (ZONEGRAN) 100 MG capsule Take 100 mg by mouth 2 (two) times daily.   Yes Historical Provider, MD  carbamazepine (TEGRETOL) 200 MG tablet Take 200 mg by mouth at bedtime.    Historical Provider, MD  QUEtiapine (SEROQUEL) 25 MG tablet Take 75 mg by mouth at bedtime.     Historical Provider, MD   BP 142/55 mmHg  Pulse 54  Temp(Src) 97.4 F (36.3 C) (Rectal)  Resp 17  SpO2 96% Physical Exam  Constitutional: He appears well-developed. No distress.  HENT:  Head: Atraumatic.  Patient has abnormal skull from previous surgery with large indent top portion/frontal dry mucous membranes  Eyes: Conjunctivae are normal. Right eye exhibits no discharge. Left eye exhibits no discharge.  Neck: Normal range of motion. Neck supple. No tracheal deviation present.  Cardiovascular: Normal rate and regular rhythm.   Pulmonary/Chest: Breath sounds normal. Respiratory distress: decreased effort no obvious rales appreciated however difficult exam.  Abdominal: Soft. He exhibits no distension. There is no tenderness. There is no guarding.  Musculoskeletal: He exhibits no edema.  Neurological: He is alert. GCS eye subscore is 4. GCS verbal subscore is 4. GCS motor subscore is 6.  General confusion, horizontal eye movements briefly intact, difficult exam due to dementia, pupils equal responsive bilateral, no obvious facial droop, legs flexed equally bilateral, equal arm strength can hold up against gravity on his own, minimal verbal baseline dementia  Skin: Skin is warm. No rash noted.  Psychiatric:  Pleasant dementia  Nursing note and vitals reviewed.   ED Course  Procedures (including critical care time) Labs Review Labs Reviewed  CBC WITH DIFFERENTIAL/PLATELET - Abnormal; Notable for the following:    RBC 3.70 (*)    Hemoglobin 11.6 (*)    HCT 34.2 (*)    Neutrophils Relative % 89 (*)    Neutro Abs 9.3 (*)    Lymphocytes Relative 4 (*)    Lymphs  Abs 0.5 (*)    All other components within normal limits  COMPREHENSIVE METABOLIC PANEL - Abnormal; Notable for the following:    Glucose, Bld 168 (*)    BUN 36 (*)    Creatinine, Ser 1.37 (*)    Total Protein 5.5 (*)    Albumin 3.2 (*)    AST 54 (*)    GFR calc non Af Amer 48 (*)    GFR calc Af Amer 56 (*)    All other components within normal limits  URINALYSIS, ROUTINE W REFLEX MICROSCOPIC - Abnormal; Notable for the following:    Hgb urine dipstick LARGE (*)    Protein, ur 30 (*)    Leukocytes, UA SMALL (*)    All other components within normal limits  URINE MICROSCOPIC-ADD ON - Abnormal; Notable for the following:    Bacteria, UA FEW (*)    All other components within normal limits  I-STAT CG4 LACTIC ACID, ED - Abnormal; Notable for the following:    Lactic Acid, Venous 2.78 (*)    All other components within normal limits  URINE CULTURE  BLOOD GAS, ARTERIAL  CARBAMAZEPINE LEVEL, TOTAL  POC OCCULT BLOOD, ED  I-STAT TROPOININ, ED    Imaging Review Ct Head Wo Contrast  04/01/2014   CLINICAL DATA:  Increase in confusion and lethargy  EXAM: CT HEAD WITHOUT CONTRAST  TECHNIQUE: Contiguous axial images were obtained from the base of the skull through the vertex without intravenous contrast.  COMPARISON:  October 04, 2012  FINDINGS: There is a ventriculoperitoneal shunt catheter with the tip in the midline between the lateral ventricles, stable. Mild diffuse atrophy remains is stable. There is no intracranial mass, hemorrhage, extra-axial fluid collection, or midline shift. The patient has undergone previous left frontal and temporal craniotomy with the postoperative defects of stable. There is ex vacuo phenomenon involving the anterior horn of the left lateral ventricle with evidence of prior infarct in the left frontal lobe, stable. There is evidence of a prior infarct in the anterior left lentiform nucleus as well as a prior infarct involving the posterior limb of the right internal  capsule. There is a prior infarct in the inferior left cerebellum.  In comparison with the previous study, there is a focal infarct in the inferior right cerebellum as well as an infarct in the medial inferior right occipital lobe. These infarcts may be recent.  Patchy small vessel disease throughout the centra semiovale is overall stable.  The mastoid air cells are clear. There is extensive opacification of most of the sphenoid sinus complex.  IMPRESSION: Suspect recent/acute infarct in the medial right occipital lobe. A second infarct is noted in the posterior right cerebellum which may also be recent/acute. This distribution suggests potential embolic phenomenon in the right posterior circulation. Prior infarcts in the supratentorial region appear stable compared to prior study as does a prior infarct in the inferior left cerebellum. There is a shunt catheter with the tip in the midline between the lateral ventricles, stable. Postoperative defect on the left is stable.  There is new opacification of much of the sphenoid sinus complex.   Electronically Signed   By: Bretta Bang III M.D.   On: 04/01/2014 08:47   Dg Chest Port 1 View  04/01/2014   CLINICAL DATA:  Irregular breathing  EXAM: PORTABLE CHEST - 1 VIEW  COMPARISON:  10/04/2012  FINDINGS: Cardiac shadow is stable. Shunt catheter is again noted along the right chest stable in appearance. No focal infiltrate or sizable effusion is seen. No bony abnormality is noted.  IMPRESSION: No acute abnormality seen.   Electronically Signed   By: Alcide Clever M.D.   On: 04/01/2014 08:01     EKG Interpretation   Date/Time:  Monday April 01 2014 07:16:09 EDT Ventricular Rate:  62 PR Interval:  220 QRS Duration: 118 QT Interval:  433 QTC Calculation: 440 R Axis:   -62 Text Interpretation:  Sinus rhythm Prolonged PR interval Left anterior  fascicular block Anteroseptal infarct, age indeterminate Confirmed by  Phiona Ramnauth  MD, Cleveland Paiz ([id number]) on 04/01/2014  7:20:11 AM      MDM   Final diagnoses:  Transient confusion  Apnea  Stroke, thrombotic  Lactic acid elevated  Patient presents after episode of less responsiveness/apnea, currently patient baseline. Discussed with family wishes for the patient and agreed to look for metabolic, infectious cardiac, CNS pathology with blood work, IV fluids, urinalysis and CT head  with chest x-ray.  CT results reviewed concerning for subacute stroke. Patient had baseline per family. With significant dementia history discussed with neurology for any medical management. Family wants patient comfortable no aggressive measures. Discussed possible discharge to nursing home versus observation ER. Medicine evaluating for final decision. Family comfortable with either plan. No acute changes in ER. IV fluids given.  The patients results and plan were reviewed and discussed.   Any x-rays performed were personally reviewed by myself.   Differential diagnosis were considered with the presenting HPI.  Medications  sodium chloride 0.9 % bolus 500 mL (not administered)  sodium chloride 0.9 % bolus 500 mL (0 mLs Intravenous Stopped 04/01/14 0849)  aspirin suppository 300 mg (300 mg Rectal Given 04/01/14 0907)    Filed Vitals:   04/01/14 1030 04/01/14 1100 04/01/14 1115 04/01/14 1145  BP: 159/65 127/65 113/63 142/55  Pulse: 61 65 59 54  Temp:      TempSrc:      Resp: 12 12 14 17   SpO2: 99% 97% 96% 96%    Final diagnoses:  Transient confusion  Apnea  Stroke, thrombotic    Admission/ observation were discussed with the admitting physician, patient and/or family and they are comfortable with the plan.    Blane Ohara, MD 04/01/14 1155

## 2014-04-01 NOTE — Consult Note (Addendum)
Referring Physician: Jodi Mourning    Chief Complaint: [place] Setting  HPI: Ian Sellers is an 80 y.o. male SNF resident.  Patient can not give history and history obtained from family.  Patient was heard by staff to be making noises in his sleep.  When they went to check on him he was difficult to awaken and EMS was called.  Since arrival in the ED the patient has had episodes of apnea but it seems that the patient has a history of sleep apnea.  Has a machine for this that the facility has not been using.  O2 sat has been above 90 in the ED.  At baseline patient sleeps in the facility until dinner time.  He is not ambulatory but the staff is able to transfer him to a wheelchair.  He is incontinent and needs assistance for meals.  Does not always recognize family members. Had some medication changes about 2 weeks ago but unclear what was changed.       Date last known well: Unable to determine Time last known well: Unable to determine tPA Given: No: History of ICH and unknown LKW  Past Medical History  Diagnosis Date  . Intracerebral hemorrhage   . Bradycardia   . Personal history of other disorders of nervous system and sense organs     seizure   . Syncope and collapse   . Cerebral aneurysm, nonruptured   . HTN (hypertension)   . Dementia     Past Surgical History  Procedure Laterality Date  . Colonoscopy      x2. one w/hot biopsy. one w/ polypectomy  . Cerebral aneurysm repair      s/p surgery  . Ventriculoperitoneal shunt      Family history: Son alive and well.    Social History:  reports that he has been smoking.  He does not have any smokeless tobacco history on file. He reports that he does not drink alcohol or use illicit drugs.  Allergies:  Allergies  Allergen Reactions  . Sulfa Antibiotics Other (See Comments)    Childhood allergy; reaction unknown    Medications: I have reviewed the patient's current medications. Prior to Admission:  Current outpatient prescriptions:  .   alfuzosin (UROXATRAL) 10 MG 24 hr tablet, Take 10 mg by mouth daily with breakfast., Disp: , Rfl:  .  Amino Acids-Protein Hydrolys (FEEDING SUPPLEMENT, PRO-STAT SUGAR FREE 64,) LIQD, Take 30 mLs by mouth 3 (three) times daily., Disp: , Rfl:  .  amLODipine (NORVASC) 10 MG tablet, Take 10 mg by mouth every morning. , Disp: , Rfl:  .  aspirin 81 MG chewable tablet, Chew 81 mg by mouth every morning., Disp: , Rfl:  .  Calcium Carbonate-Vitamin D (CALCIUM 600 + D PO), Take 1 tablet by mouth daily., Disp: , Rfl:  .  Cholecalciferol (VITAMIN D3) 2000 UNITS TABS, Take 2,000 Units by mouth 2 (two) times daily., Disp: , Rfl:  .  diphenhydrAMINE (BENADRYL) 25 mg capsule, Take 25 mg by mouth 2 (two) times daily as needed for itching., Disp: , Rfl:  .  isosorbide mononitrate (IMDUR) 30 MG 24 hr tablet, Take 30 mg by mouth daily., Disp: , Rfl:  .  lamoTRIgine (LAMICTAL) 100 MG tablet, Take 100 mg by mouth daily., Disp: , Rfl:  .  levETIRAcetam (KEPPRA) 500 MG tablet, Take 500 mg by mouth 2 (two) times daily., Disp: , Rfl:  .  loratadine (CLARITIN) 10 MG tablet, Take 10 mg by mouth daily., Disp: , Rfl:  .  Multiple Vitamin (MULTIVITAMIN) tablet, Take 1 tablet by mouth every morning. , Disp: , Rfl:  .  Nutritional Supplements (ENSURE PO), Take 1 Can by mouth 2 (two) times daily. With lunch and supper, Disp: , Rfl:  .  oxyCODONE (OXY IR/ROXICODONE) 5 MG immediate release tablet, Take 5 mg by mouth every 12 (twelve) hours. , Disp: , Rfl:  .  polyethylene glycol (MIRALAX / GLYCOLAX) packet, Take 17 g by mouth daily., Disp: , Rfl:  .  QUEtiapine (SEROQUEL) 50 MG tablet, Take 50 mg by mouth at bedtime., Disp: , Rfl:  .  trihexyphenidyl (ARTANE) 5 MG tablet, Take 2.5 mg by mouth 2 (two) times daily., Disp: , Rfl:  .  valsartan (DIOVAN) 160 MG tablet, Take 160 mg by mouth daily. , Disp: , Rfl:  .  zonisamide (ZONEGRAN) 100 MG capsule, Take 100 mg by mouth 2 (two) times daily., Disp: , Rfl:  .  carbamazepine (TEGRETOL)  200 MG tablet, Take 200 mg by mouth at bedtime., Disp: , Rfl:  .  QUEtiapine (SEROQUEL) 25 MG tablet, Take 75 mg by mouth at bedtime. , Disp: , Rfl:   ROS: History obtained from wife  General ROS: fatigue Psychological ROS: memory difficulties Ophthalmic ROS: negative for - blurry vision, double vision, eye pain or loss of vision ENT ROS: negative for - epistaxis, nasal discharge, oral lesions, sore throat, tinnitus or vertigo Allergy and Immunology ROS: negative for - hives or itchy/watery eyes Hematological and Lymphatic ROS: negative for - bleeding problems, bruising or swollen lymph nodes Endocrine ROS: negative for - galactorrhea, hair pattern changes, polydipsia/polyuria or temperature intolerance Respiratory ROS: negative for - cough, hemoptysis, shortness of breath or wheezing Cardiovascular ROS: negative for - chest pain, dyspnea on exertion, edema or irregular heartbeat Gastrointestinal ROS: negative for - abdominal pain, diarrhea, hematemesis, nausea/vomiting or stool incontinence Genito-Urinary ROS: negative for - dysuria, hematuria, incontinence or urinary frequency/urgency Musculoskeletal ROS: leg contractures Neurological ROS: as noted in HPI Dermatological ROS: negative for rash and skin lesion changes  Physical Examination: Blood pressure 172/48, pulse 51, temperature 97.4 F (36.3 C), temperature source Rectal, resp. rate 12, SpO2 96 %.  HEENT-  Normocephalic, no lesions, without obvious abnormality.  Normal external eye and conjunctiva.  Normal TM's bilaterally.  Normal auditory canals and external ears. Normal external nose, mucus membranes and septum.  Normal pharynx. Cardiovascular- S1, S2 normal, pulses palpable throughout   Lungs- chest clear, no wheezing, rales, normal symmetric air entry Abdomen- soft, non-tender; bowel sounds normal; no masses,  no organomegaly Extremities- no edema Lymph-no adenopathy palpable Musculoskeletal-no joint tenderness, deformity  or swelling Skin-warm and dry, no hyperpigmentation, vitiligo, or suspicious lesions  Neurological Examination Mental Status: Lethargic.  Follows some simple commands.  Speaks in response to pain.   Cranial Nerves: II: Discs flat bilaterally; Pupils equal, round, reactive to light and accommodation III,IV, VI: ptosis not present, oculocephalic maneuvers intact.   V,VII: right facial droop, facial light touch sensation normal bilaterally VIII: hearing normal bilaterally IX,X: does not cooperate for exam XI: des not cooperate for exam XII: midline tongue extension Motor: Moves both upper extremities although minimally due to cooperation.  RUE fisted.  Lower extremities flexed and with increased tone.   Sensory: Responds to light noxious stimuli throughout Deep Tendon Reflexes: 1+ in the upper extremities, absent in the lower extremities Plantars: Right: upgoing   Left: upgoing Cerebellar: Unable to perform secondary to cooperation Gait: patient non-ambulatory   Laboratory Studies:  Basic Metabolic Panel:  Recent Labs  Lab 04/01/14 0748  NA 143  K 4.1  CL 111  CO2 22  GLUCOSE 168*  BUN 36*  CREATININE 1.37*  CALCIUM 8.9    Liver Function Tests:  Recent Labs Lab 04/01/14 0748  AST 54*  ALT 52  ALKPHOS 66  BILITOT 0.5  PROT 5.5*  ALBUMIN 3.2*   No results for input(s): LIPASE, AMYLASE in the last 168 hours. No results for input(s): AMMONIA in the last 168 hours.  CBC:  Recent Labs Lab 04/01/14 0748  WBC 10.4  NEUTROABS 9.3*  HGB 11.6*  HCT 34.2*  MCV 92.4  PLT 207    Cardiac Enzymes: No results for input(s): CKTOTAL, CKMB, CKMBINDEX, TROPONINI in the last 168 hours.  BNP: Invalid input(s): POCBNP  CBG: No results for input(s): GLUCAP in the last 168 hours.  Microbiology: No results found for this or any previous visit.  Coagulation Studies: No results for input(s): LABPROT, INR in the last 72 hours.  Urinalysis:  Recent Labs Lab  04/01/14 0842  COLORURINE YELLOW  LABSPEC 1.020  PHURINE 5.5  GLUCOSEU NEGATIVE  HGBUR LARGE*  BILIRUBINUR NEGATIVE  KETONESUR NEGATIVE  PROTEINUR 30*  UROBILINOGEN 0.2  NITRITE NEGATIVE  LEUKOCYTESUR SMALL*    Lipid Panel: No results found for: CHOL, TRIG, HDL, CHOLHDL, VLDL, LDLCALC  HgbA1C: No results found for: HGBA1C  Urine Drug Screen:  No results found for: LABOPIA, COCAINSCRNUR, LABBENZ, AMPHETMU, THCU, LABBARB  Alcohol Level: No results for input(s): ETH in the last 168 hours.  Other results: EKG: sinus rhythm at 62 bpm.  Imaging: Ct Head Wo Contrast  04/01/2014   CLINICAL DATA:  Increase in confusion and lethargy  EXAM: CT HEAD WITHOUT CONTRAST  TECHNIQUE: Contiguous axial images were obtained from the base of the skull through the vertex without intravenous contrast.  COMPARISON:  October 04, 2012  FINDINGS: There is a ventriculoperitoneal shunt catheter with the tip in the midline between the lateral ventricles, stable. Mild diffuse atrophy remains is stable. There is no intracranial mass, hemorrhage, extra-axial fluid collection, or midline shift. The patient has undergone previous left frontal and temporal craniotomy with the postoperative defects of stable. There is ex vacuo phenomenon involving the anterior horn of the left lateral ventricle with evidence of prior infarct in the left frontal lobe, stable. There is evidence of a prior infarct in the anterior left lentiform nucleus as well as a prior infarct involving the posterior limb of the right internal capsule. There is a prior infarct in the inferior left cerebellum.  In comparison with the previous study, there is a focal infarct in the inferior right cerebellum as well as an infarct in the medial inferior right occipital lobe. These infarcts may be recent.  Patchy small vessel disease throughout the centra semiovale is overall stable.  The mastoid air cells are clear. There is extensive opacification of most of the  sphenoid sinus complex.  IMPRESSION: Suspect recent/acute infarct in the medial right occipital lobe. A second infarct is noted in the posterior right cerebellum which may also be recent/acute. This distribution suggests potential embolic phenomenon in the right posterior circulation. Prior infarcts in the supratentorial region appear stable compared to prior study as does a prior infarct in the inferior left cerebellum. There is a shunt catheter with the tip in the midline between the lateral ventricles, stable. Postoperative defect on the left is stable.  There is new opacification of much of the sphenoid sinus complex.   Electronically Signed   By: Chrissie Noa  Margarita GrizzleWoodruff III M.D.   On: 04/01/2014 08:47   Dg Chest Port 1 View  04/01/2014   CLINICAL DATA:  Irregular breathing  EXAM: PORTABLE CHEST - 1 VIEW  COMPARISON:  10/04/2012  FINDINGS: Cardiac shadow is stable. Shunt catheter is again noted along the right chest stable in appearance. No focal infiltrate or sizable effusion is seen. No bony abnormality is noted.  IMPRESSION: No acute abnormality seen.   Electronically Signed   By: Alcide CleverMark  Lukens M.D.   On: 04/01/2014 08:01    Assessment: 80 y.o. male presenting after being difficult to arouse this morning.  Per family patient is lethargic throughout much of the [place] at baseline and does not wake up until much later in the [place].  He has quite a significant dementia but was in the SNF due to back problems about a year ago.  After being there for about 2 weeks his legs drew up and he was no longer able to walk.  Unclear what caused that.  Head Ct performed today has been personally reviewed and shows areas of hypodensity that were not present on the past CT in the right occipital lobe and the right cerebellum.  Diagnostic and possible treatment options discussed with family.  They wish is to keep the patient comfortable and do not want extensive work up, including MRI, etc.  Patient on ASA daily.   Although stroke  may be likely, can not rule out seizure or complications from sleep apnea.  Seizure less likely with patient being on 4 anticonvulsants at this time.    Stroke Risk Factors - hypertension  Plan: 1. Would change ASA to Plavix 75mg  daily 2.  No stroke work up indicated at this time.   3.  Tegretol level  Case discussed with Dr. Gonzella Lexhungel (admitting MD)  Thana FarrLeslie Korbyn Vanes, MD [facility] [phone] 04/01/2014, 11:15 AM

## 2014-04-01 NOTE — ED Notes (Signed)
[facility] Assisted by William HamburgerJudith EMT and Apolinar JunesBrandon EMT.

## 2014-04-01 NOTE — ED Notes (Signed)
Patient transported to CT 

## 2014-04-01 NOTE — ED Notes (Signed)
Dr. Jodi MourningZavitz back at the bedside to update family.

## 2014-04-01 NOTE — ED Notes (Signed)
Xray completed

## 2014-04-01 NOTE — ED Notes (Signed)
Per [facility] pt from [facility] for irregular breathing. Called out for apnea. No moments of apnea with EMS. Pt seems to hold breathe for few seconds. Pt was difficult to wake up this morning per staff but they were unsure whether that was normal. Lungs are clear in all fields but gurgling in his throat. Pt sats on 4liters were 94 %. [facility] Per spouse and son pt is at his baseline at this time.

## 2014-04-01 NOTE — H&P (Signed)
[facility] History and Physical                                                                                    Ian PintoDavid Sellers, is a 80 y.o. male  MRN: [id number]   DOB - 07/25/1934  Admit Date - 04/01/2014  Outpatient Primary MD for the patient is [name],[name], MD  With History of -  Past Medical History  Diagnosis Date  . Intracerebral hemorrhage   . Bradycardia   . Personal history of other disorders of nervous system and sense organs     seizure   . Syncope and collapse   . Cerebral aneurysm, nonruptured   . HTN (hypertension)   . Dementia       Past Surgical History  Procedure Laterality Date  . Colonoscopy      x2. one w/hot biopsy. one w/ polypectomy  . Cerebral aneurysm repair      s/p surgery  . Ventriculoperitoneal shunt      in for   Chief Complaint  Patient presents with  . Respiratory Distress     HPI  Ian Sellers  is a 80 y.o. male, who has dementia and is wheelchair bound.  He lives in [facility].  His family is very supportive and interact with him daily.  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  He was sent to the ER this morning for decreased mental status and apnea.   The family states he has been doing well recently.  He has been on CPAP and oxygen in the past but no longer needs it.  He normally eats a pureed diet but yesterday successfully ate regular ham for Easter dinner with the family.  He has had no recent illness, has been conversant, and normally paddles around [facility] in his wheelchair.  The family notes that today he is excessively sleepy and has been snoring very loudly.  At times he will simply stop breathing for several seconds.  In the ER Mr. Ian Sellers is found to have two new strokes since his last CT head.  Radiology notes that they are "recent/acute".  The patient's creatinine is slightly elevated compared to baseline, his U/A has a few bacteria.  He is lethargic and will only wake for a  few seconds - but recognizes his wife and son.  When I asked how he felt, he smiled and replied "lucky".  Stat ABG is pending.  Review of Systems   In addition to the HPI above,  Patient is unable to provide secondary to lethargy, but per wife he has been generally well lately.  Social History History  Substance Use Topics  . Smoking status: Current Every [place] Smoker -- 0.50 packs/Ra  . Smokeless tobacco: Not on file  . Alcohol Use: No    Family History Patient is unable to provide.  Prior to Admission medications   Medication Sig Start Date End Date Taking? Authorizing Provider  alfuzosin (UROXATRAL) 10 MG 24 hr tablet Take 10 mg by mouth daily with breakfast.   Yes Historical Provider, MD  Amino Acids-Protein Hydrolys (FEEDING SUPPLEMENT, PRO-STAT SUGAR FREE 64,)  LIQD Take 30 mLs by mouth 3 (three) times daily.   Yes Historical Provider, MD  amLODipine (NORVASC) 10 MG tablet Take 10 mg by mouth every morning.    Yes Historical Provider, MD  aspirin 81 MG chewable tablet Chew 81 mg by mouth every morning.   Yes Historical Provider, MD  Calcium Carbonate-Vitamin D (CALCIUM 600 + D PO) Take 1 tablet by mouth daily.   Yes Historical Provider, MD  Cholecalciferol (VITAMIN D3) 2000 UNITS TABS Take 2,000 Units by mouth 2 (two) times daily.   Yes Historical Provider, MD  diphenhydrAMINE (BENADRYL) 25 mg capsule Take 25 mg by mouth 2 (two) times daily as needed for itching.   Yes Historical Provider, MD  isosorbide mononitrate (IMDUR) 30 MG 24 hr tablet Take 30 mg by mouth daily.   Yes Historical Provider, MD  lamoTRIgine (LAMICTAL) 100 MG tablet Take 100 mg by mouth daily.   Yes Historical Provider, MD  levETIRAcetam (KEPPRA) 500 MG tablet Take 500 mg by mouth 2 (two) times daily.   Yes Historical Provider, MD  loratadine (CLARITIN) 10 MG tablet Take 10 mg by mouth daily.   Yes Historical Provider, MD  Multiple Vitamin (MULTIVITAMIN) tablet Take 1 tablet by mouth every morning.    Yes  Historical Provider, MD  Nutritional Supplements (ENSURE PO) Take 1 Can by mouth 2 (two) times daily. With lunch and supper   Yes Historical Provider, MD  oxyCODONE (OXY IR/ROXICODONE) 5 MG immediate release tablet Take 5 mg by mouth every 12 (twelve) hours.    Yes Historical Provider, MD  polyethylene glycol (MIRALAX / GLYCOLAX) packet Take 17 g by mouth daily.   Yes Historical Provider, MD  QUEtiapine (SEROQUEL) 50 MG tablet Take 50 mg by mouth at bedtime.   Yes Historical Provider, MD  trihexyphenidyl (ARTANE) 5 MG tablet Take 2.5 mg by mouth 2 (two) times daily.   Yes Historical Provider, MD  valsartan (DIOVAN) 160 MG tablet Take 160 mg by mouth daily.    Yes Historical Provider, MD  zonisamide (ZONEGRAN) 100 MG capsule Take 100 mg by mouth 2 (two) times daily.   Yes Historical Provider, MD  carbamazepine (TEGRETOL) 200 MG tablet Take 200 mg by mouth at bedtime.    Historical Provider, MD  QUEtiapine (SEROQUEL) 25 MG tablet Take 75 mg by mouth at bedtime.     Historical Provider, MD    Allergies  Allergen Reactions  . Sulfa Antibiotics Other (See Comments)    Childhood allergy; reaction unknown    Physical Exam  Vitals  Blood pressure 172/48, pulse 51, temperature 97.4 F (36.3 C), temperature source Rectal, resp. rate 12, SpO2 96 %.   General:  Ian Sellers, male, leaning to the left with mild facial droop, lying in bed sleeping & difficult to wake.   Wife and son at bedside.   Neuro:   Patient is lethargic (bordering on obtunded), he squeezes my hands (strength symmetric) but is sound asleep and unable to wake before he can follow any additional commands.  ENT:   PER. Moist oral mucosa without erythema or exudates.  Neck:  Supple, No lymphadenopathy appreciated  Respiratory:  Apneic breathing with pauses, snoring.  Oxygen sats are 93+ on room air.  Cardiac:  Bradycardic, no frank m/r/g  Abdomen:  Positive bowel sounds, Soft, Non tender, Non distended,  No masses  appreciated  Skin:  No Cyanosis, Normal Skin Turgor, No Skin Rash or Bruise.   Data Review  CBC  Recent Labs Lab 04/01/14  0748  WBC 10.4  HGB 11.6*  HCT 34.2*  PLT 207  MCV 92.4  MCH 31.4  MCHC 33.9  RDW 13.2  LYMPHSABS 0.5*  MONOABS 0.7  EOSABS 0.0  BASOSABS 0.0    Chemistries   Recent Labs Lab 04/01/14 0748  NA 143  K 4.1  CL 111  CO2 22  GLUCOSE 168*  BUN 36*  CREATININE 1.37*  CALCIUM 8.9  AST 54*  ALT 52  ALKPHOS 66  BILITOT 0.5    Urinalysis    Component Value Date/Time   COLORURINE YELLOW 04/01/2014 0842   APPEARANCEUR CLEAR 04/01/2014 0842   LABSPEC 1.020 04/01/2014 0842   PHURINE 5.5 04/01/2014 0842   GLUCOSEU NEGATIVE 04/01/2014 0842   HGBUR LARGE* 04/01/2014 0842   BILIRUBINUR NEGATIVE 04/01/2014 0842   KETONESUR NEGATIVE 04/01/2014 0842   PROTEINUR 30* 04/01/2014 0842   UROBILINOGEN 0.2 04/01/2014 0842   NITRITE NEGATIVE 04/01/2014 0842   LEUKOCYTESUR SMALL* 04/01/2014 0842    Imaging results:   Ct Head Wo Contrast  04/01/2014   CLINICAL DATA:  Increase in confusion and lethargy  EXAM: CT HEAD WITHOUT CONTRAST  TECHNIQUE: Contiguous axial images were obtained from the base of the skull through the vertex without intravenous contrast.  COMPARISON:  October 04, 2012  FINDINGS: There is a ventriculoperitoneal shunt catheter with the tip in the midline between the lateral ventricles, stable. Mild diffuse atrophy remains is stable. There is no intracranial mass, hemorrhage, extra-axial fluid collection, or midline shift. The patient has undergone previous left frontal and temporal craniotomy with the postoperative defects of stable. There is ex vacuo phenomenon involving the anterior horn of the left lateral ventricle with evidence of prior infarct in the left frontal lobe, stable. There is evidence of a prior infarct in the anterior left lentiform nucleus as well as a prior infarct involving the posterior limb of the right internal capsule.  There is a prior infarct in the inferior left cerebellum.  In comparison with the previous study, there is a focal infarct in the inferior right cerebellum as well as an infarct in the medial inferior right occipital lobe. These infarcts may be recent.  Patchy small vessel disease throughout the centra semiovale is overall stable.  The mastoid air cells are clear. There is extensive opacification of most of the sphenoid sinus complex.  IMPRESSION: Suspect recent/acute infarct in the medial right occipital lobe. A second infarct is noted in the posterior right cerebellum which may also be recent/acute. This distribution suggests potential embolic phenomenon in the right posterior circulation. Prior infarcts in the supratentorial region appear stable compared to prior study as does a prior infarct in the inferior left cerebellum. There is a shunt catheter with the tip in the midline between the lateral ventricles, stable. Postoperative defect on the left is stable.  There is new opacification of much of the sphenoid sinus complex.   Electronically Signed   By: Bretta Bang III M.D.   On: 04/01/2014 08:47   Dg Chest Port 1 View  04/01/2014   CLINICAL DATA:  Irregular breathing  EXAM: PORTABLE CHEST - 1 VIEW  COMPARISON:  10/04/2012  FINDINGS: Cardiac shadow is stable. Shunt catheter is again noted along the right chest stable in appearance. No focal infiltrate or sizable effusion is seen. No bony abnormality is noted.  IMPRESSION: No acute abnormality seen.   Electronically Signed   By: Alcide Clever M.D.   On: 04/01/2014 08:01    My personal review of EKG:  sinus rhythm with prolong PR and normal QTc   Assessment & Plan  Principal Problem:   Stroke Active Problems:   Elevated serum creatinine   Dementia   Apnea   Essential hypertension   Bradycardia   Intracerebral hemorrhage   Lactic acidosis   Stroke Patient with 2 new/recent strokes noted on CT, but new lethargy and apnea. Neurology has  been consulted and I will defer the decision about MRI/MRA and full stroke work up to them Family is very connected to Ian Sellers - despite his current condition he and his family have had a good quality of life to this point. Speech evaluation will be important.  Family does not appear to want a PEG. NPO, stroke work up without MR/MRA and carotid dopplers has been ordered. Appreciate neurology recommendations.   We will likely monitor him for 24 hours to determine whether or not he will decline and need full hospice support.  Apnea Checking stat ABG.  At a minimum will place back on CPAP QHS and likely oxygen during the [place].  Elevated serum creatinine. Likely dehydrated as BUN is elevated as well. Baseline is 1.03.  Today 1.37 Will give gentle IVF and monitor creatinine.  Lactic Acidosis (2.78) Secondary to stroke? Received 500 ml in ER.  Will give another 500 ml bolus and then start 75 ml/hour x 24 hours. Uncertain of LVEF.  Last echo was 2008.   Follow up Lactic Acid pending.  HTN Patient is now NPO.  Holding all oral BP meds. PRN Hydralazine ordered.  Please allow for permissive hypertension in the setting of stroke.  Dementia. Has been stable. Will monitor to determine if there will be a significant change in status.  Palliative Medicine Would recommend GOC consultation at [facility] when patient returns. Son wants focus to be on comfort.  Wife doesn't appear as though she is quite at the same point. Patient is likely Hospice eligible and may benefit from not having to return to the hospital.  DVT Prophylaxis: heparin  AM Labs Ordered, also please review Full Orders  Family Communication:   Wife and son at bedside  Code Status:  DNR  Condition:  Guarded.  Time spent in minutes : [address]  PA-C on 04/01/2014 at 11:06 AM  Between 7am to 7pm - Pager - [phone]  After 7pm go to [email or web address] - password [email or web address]  And look for the night coverage person covering  me after hours  [facility]

## 2014-04-02 ENCOUNTER — Inpatient Hospital Stay (HOSPITAL_COMMUNITY): Payer: Medicare Other

## 2014-04-02 DIAGNOSIS — I10 Essential (primary) hypertension: Secondary | ICD-10-CM

## 2014-04-02 DIAGNOSIS — I6350 Cerebral infarction due to unspecified occlusion or stenosis of unspecified cerebral artery: Secondary | ICD-10-CM

## 2014-04-02 DIAGNOSIS — R748 Abnormal levels of other serum enzymes: Secondary | ICD-10-CM

## 2014-04-02 LAB — LIPID PANEL
CHOL/HDL RATIO: 3.6 ratio
CHOLESTEROL: 135 mg/dL (ref 0–200)
HDL: 38 mg/dL — ABNORMAL LOW (ref >39)
LDL Cholesterol: 84 mg/dL (ref 0–99)
Triglycerides: 65 mg/dL (ref <150)
VLDL: 13 mg/dL (ref 0–40)

## 2014-04-02 LAB — GLUCOSE, CAPILLARY
GLUCOSE-CAPILLARY: 100 mg/dL — AB (ref 70–99)
GLUCOSE-CAPILLARY: 101 mg/dL — AB (ref 70–99)
Glucose-Capillary: 110 mg/dL — ABNORMAL HIGH (ref 70–99)
Glucose-Capillary: 99 mg/dL (ref 70–99)

## 2014-04-02 MED ORDER — LORAZEPAM 2 MG/ML IJ SOLN: 0.5000 mg | Freq: Three times a day (TID) | INTRAMUSCULAR | Status: DC | PRN

## 2014-04-02 MED ORDER — SODIUM CHLORIDE 0.9 % IV SOLN
INTRAVENOUS | Status: DC
  Administered 2014-04-02 – 2014-04-04 (×3): via INTRAVENOUS
  Filled 2014-04-02: qty 1000

## 2014-04-02 MED ORDER — METOPROLOL TARTRATE 1 MG/ML IV SOLN
2.5000 mg | Freq: Three times a day (TID) | INTRAVENOUS | Status: DC
  Administered 2014-04-02 – 2014-04-04 (×6): 2.5 mg via INTRAVENOUS
  Filled 2014-04-02 (×6): qty 5

## 2014-04-02 NOTE — Progress Notes (Signed)
CARE MANAGEMENT NOTE 04/02/2014  Patient:  Ian Sellers,Ian Sellers   Account Number:  [id number]  Date Initiated:  04/02/2014  Documentation initiated by:  Lawerance SabalSWIST,DEBBIE  Subjective/Objective Assessment:   +CVA, from SNF,     Action/Plan:   will follow for d/c needs   Anticipated DC Date:  04/04/2014   Anticipated DC Plan:  SKILLED NURSING FACILITY         Choice offered to / List presented to:             Status of service:  In process, will continue to follow Medicare Important Message given?   (If response is "NO", the following Medicare IM given date fields will be blank) Date Medicare IM given:   Medicare IM given by:   Date Additional Medicare IM given:   Additional Medicare IM given by:    Discharge Disposition:    Per UR Regulation:    If discussed at Long Length of Stay Meetings, dates discussed:    Comments:

## 2014-04-02 NOTE — Progress Notes (Signed)
  Echocardiogram 2D Echocardiogram has been performed.  Ian Sellers, Ian Sellers A 04/02/2014, 12:16 PM

## 2014-04-02 NOTE — Evaluation (Signed)
Physical Therapy Evaluation Patient Details Name: Ian Sellers MRN: [id number] DOB: 12-Feb-1934 Today's Date: 04/02/2014   History of Present Illness  Patient is a 80 y/o male with PMH significant for ICH with shunt in place, bradycardia, seizure, HTN, and syncope.Pt was sent to the ER for decreased mental status and apnea. Pt is a resident at [facility] and is w/c bound at baseline. Head CT-Suspect recent/acute infarct in the medial right occipital lobe and a second infarct is noted in the posterior right cerebellum which may be subacute.    Clinical Impression  PTA, pt dependent for all ADLs and uses lift to transfer to w/c. Pt able to self propel w/c with UEs and feed self at SNF. Pt is functioning close to baseline and will have the necessary assist at SNF at discharge. Encouraged RN to use maxi move lift to transfer pt to chair. Pt does not require skilled therapy services due as pt functioning close to baseline. Per MD notes, pt to transfer back to [facility] on palliative care. Discharge from therapy.    Follow Up Recommendations SNF    Equipment Recommendations  None recommended by PT    Recommendations for Other Services       Precautions / Restrictions Precautions Precautions: Fall Restrictions Weight Bearing Restrictions: No      Mobility  Bed Mobility Overal bed mobility: Needs Assistance Bed Mobility: Rolling;Sidelying to Sit;Sit to Sidelying Rolling: Min guard Sidelying to sit: Min assist;HOB elevated     Sit to sidelying: Min guard;HOB elevated General bed mobility comments: Use of rails for support. Min A to elevate trunk.   Transfers Overall transfer level:  (Deferred as pt uses lift at baseline for transfers.)                  Ambulation/Gait             General Gait Details: Pt non ambulatory.  Stairs            Wheelchair Mobility    Modified Rankin (Stroke Patients Only) Modified Rankin (Stroke Patients Only) Pre-Morbid Rankin  Score: Moderately severe disability Modified Rankin: Moderately severe disability     Balance Overall balance assessment: Needs assistance Sitting-balance support: Feet unsupported;No upper extremity supported Sitting balance-Leahy Scale: Good Sitting balance - Comments: Able to tolerate perturbations in all directions with only minor sway but no LOB. Min manual cues for thoracic and lumbar extension to facilitate upright posture.                                      Pertinent Vitals/Pain Pain Assessment: No/denies pain    Home Living Family/patient expects to be discharged to:: Skilled nursing facility                 Additional Comments: Pt lives at [facility]. Wife visits everyday and is very supportive.     Prior Function Level of Independence: Needs assistance   Gait / Transfers Assistance Needed: Pt is non ambulatory but propels in w/c with UEs. Transfers to w/c via lift.   ADL's / Homemaking Assistance Needed: Total A for ADLs. Can feed self some of the time per wife.         Hand Dominance   Dominant Hand: Right    Extremity/Trunk Assessment   Upper Extremity Assessment: Overall WFL for tasks assessed;Defer to OT evaluation  Lower Extremity Assessment: RLE deficits/detail;LLE deficits/detail;Generalized weakness RLE Deficits / Details: Pt contracted in bil hips to ~90 degrees and knees ~100 degrees. Able to extend LLE to ~80 degrees.  LLE Deficits / Details: Pt contracted in bil hips to ~90 degrees and knees ~100 degrees. Able to extend LLE to ~80 degrees.      Communication   Communication: No difficulties  Cognition Arousal/Alertness: Awake/alert Behavior During Therapy: WFL for tasks assessed/performed Overall Cognitive Status: History of cognitive impairments - at baseline                      General Comments General comments (skin integrity, edema, etc.): Spouse present inr oom during evaluation. Wife reports  pt has not been getting PT for a long time.     Exercises        Assessment/Plan    PT Assessment Patent does not need any further PT services  PT Diagnosis Generalized weakness   PT Problem List    PT Treatment Interventions     PT Goals (Current goals can be found in the Care Plan section) Acute Rehab PT Goals PT Goal Formulation: All assessment and education complete, DC therapy    Frequency     Barriers to discharge        Co-evaluation               End of Session   Activity Tolerance: Patient limited by fatigue Patient left: in bed;with call bell/phone within reach;with bed alarm set;with family/visitor present Nurse Communication: Mobility status;Need for lift equipment         Time: [id number] PT Time Calculation (min) (ACUTE ONLY): 19 min   Charges:   PT Evaluation $Initial PT Evaluation Tier I: 1 Procedure     PT G CodesAlvie Heidelberg:        Folan, Daylah Sayavong A 04/02/2014, 4:23 PM  Alvie HeidelbergShauna Folan, PT, DPT [phone]

## 2014-04-02 NOTE — Progress Notes (Signed)
[facility] PROGRESS NOTE  Ian Sellers WUJ:[id number] DOB: June 27, 1934 DOA: 04/01/2014 PCP: Everlean Cherry, MD  Assessment/Plan: #1 acute CVA Likely embolic in nature. Per head CT. 2-D echo with no source of emboli. Patient is more alert today. Family wishes to keep patient, stable and did not want an extensive workup including MRI which was canceled. Patient was on aspirin daily. Patient has been recommended to change from aspirin to Plavix per neurology recommendations. Once able to take orals will need to switch aspirin to Plavix. Patient failed modified barium swallow and is currently nothing by mouth. Patient to be reassessed by speech therapy tomorrow. Palliative care consultation pending. Once patient diet has been addressed could possibly be discharged to [facility] facility with palliative care/hospice following.  #2 apnea Likely secondary to obstructive sleep apnea. See Pap daily at bedtime. Continue O2.  #3 elevated serum creatinine Likely secondary to prerenal azotemia. Baseline creatinine is 1.03. Improved with hydration.  #4 hypertension Patient currently nothing by mouth an oral antihypertensive medications has been held. We'll place on Lopressor 2.5 mg IV every 8 hours. Follow.  #5 dementia Stable.  #6 palliative medicine Family wants patient to be kept comfortable with no extensive workup. Palliative care consultation pending for goals of care.  #7 prophylaxis Heparin for DVT prophylaxis.   Code Status: DO NOT RESUSCITATE Family Communication: Updated wife at bedside. Disposition Plan: Back to skilled nursing facility with palliative/hospice. Pending palliative care for goals of care and pending speech therapy reevaluation for diet as patient failed modified barium swallow and is nothing by mouth.   Consultants:  Neurology: Dr. Thad Ranger 04/01/2014  Procedures:  CT head 04/01/2014  Modified barium swallow 3/29 2016  Chest x-ray 04/01/2014  2-D  echo 04/02/2014  Antibiotics:  None  HPI/Subjective: Patient with no complaints.  Objective: Filed Vitals:   04/02/14 1756  BP: 187/80  Pulse: 94  Temp: 99.1 F (37.3 C)  Resp: 20    Intake/Output Summary (Last 24 hours) at 04/02/14 1835 Last data filed at 04/02/14 1523  Gross per 24 hour  Intake 1781.25 ml  Output    850 ml  Net 931.25 ml   There were no vitals filed for this visit.  Exam:   General:  nad  Cardiovascular: RRR  Respiratory: CTAB anterior lung fields  Abdomen: Soft, nontender, nondistended, positive bowel sounds.  Musculoskeletal: No clubbing cyanosis or edema.  Data Reviewed: Basic Metabolic Panel:  Recent Labs Lab 04/01/14 0748 04/01/14 1357  NA 143  --   K 4.1  --   CL 111  --   CO2 22  --   GLUCOSE 168*  --   BUN 36*  --   CREATININE 1.37* 1.10  CALCIUM 8.9  --    Liver Function Tests:  Recent Labs Lab 04/01/14 0748  AST 54*  ALT 52  ALKPHOS 66  BILITOT 0.5  PROT 5.5*  ALBUMIN 3.2*   No results for input(s): LIPASE, AMYLASE in the last 168 hours. No results for input(s): AMMONIA in the last 168 hours. CBC:  Recent Labs Lab 04/01/14 0748  WBC 10.4  NEUTROABS 9.3*  HGB 11.6*  HCT 34.2*  MCV 92.4  PLT 207   Cardiac Enzymes: No results for input(s): CKTOTAL, CKMB, CKMBINDEX, TROPONINI in the last 168 hours. BNP (last 3 results) No results for input(s): BNP in the last 8760 hours.  ProBNP (last 3 results) No results for input(s): PROBNP in the last 8760 hours.  CBG:  Recent Labs Lab  04/01/14 1235 04/01/14 2228 04/02/14 0705 04/02/14 1141  GLUCAP 106* 107* 101* 110*    Recent Results (from the past 240 hour(s))  Urine culture     Status: None (Preliminary result)   Collection Time: 04/01/14  8:43 AM  Result Value Ref Range Status   Specimen Description URINE, CATHETERIZED  Final   Special Requests NONE  Final   Colony Count   Final    75,000 COLONIES/ML Performed at [facility]     Culture   Final    GRAM POSITIVE COCCI Performed at [facility]    Report Status PENDING  Incomplete     Studies: Ct Head Wo Contrast  04/01/2014   CLINICAL DATA:  Increase in confusion and lethargy  EXAM: CT HEAD WITHOUT CONTRAST  TECHNIQUE: Contiguous axial images were obtained from the base of the skull through the vertex without intravenous contrast.  COMPARISON:  October 04, 2012  FINDINGS: There is a ventriculoperitoneal shunt catheter with the tip in the midline between the lateral ventricles, stable. Mild diffuse atrophy remains is stable. There is no intracranial mass, hemorrhage, extra-axial fluid collection, or midline shift. The patient has undergone previous left frontal and temporal craniotomy with the postoperative defects of stable. There is ex vacuo phenomenon involving the anterior horn of the left lateral ventricle with evidence of prior infarct in the left frontal lobe, stable. There is evidence of a prior infarct in the anterior left lentiform nucleus as well as a prior infarct involving the posterior limb of the right internal capsule. There is a prior infarct in the inferior left cerebellum.  In comparison with the previous study, there is a focal infarct in the inferior right cerebellum as well as an infarct in the medial inferior right occipital lobe. These infarcts may be recent.  Patchy small vessel disease throughout the centra semiovale is overall stable.  The mastoid air cells are clear. There is extensive opacification of most of the sphenoid sinus complex.  IMPRESSION: Suspect recent/acute infarct in the medial right occipital lobe. A second infarct is noted in the posterior right cerebellum which may also be recent/acute. This distribution suggests potential embolic phenomenon in the right posterior circulation. Prior infarcts in the supratentorial region appear stable compared to prior study as does a prior infarct in the inferior left cerebellum. There is a shunt  catheter with the tip in the midline between the lateral ventricles, stable. Postoperative defect on the left is stable.  There is new opacification of much of the sphenoid sinus complex.   Electronically Signed   By: Bretta BangWilliam  Woodruff III M.D.   On: 04/01/2014 08:47   Dg Chest Port 1 View  04/01/2014   CLINICAL DATA:  Irregular breathing  EXAM: PORTABLE CHEST - 1 VIEW  COMPARISON:  10/04/2012  FINDINGS: Cardiac shadow is stable. Shunt catheter is again noted along the right chest stable in appearance. No focal infiltrate or sizable effusion is seen. No bony abnormality is noted.  IMPRESSION: No acute abnormality seen.   Electronically Signed   By: Alcide CleverMark  Lukens M.D.   On: 04/01/2014 08:01   Dg Swallowing Func-speech Pathology  04/02/2014    Objective Swallowing Evaluation:   Modified Barium Swallow Patient Details  Name: Teena IraniDavid M Ledwith MRN: [id number] Date of Birth: 12/08/1934  Today's Date: 04/02/2014 Time: SLP Start Time (ACUTE ONLY): 1345-SLP Stop Time (ACUTE ONLY): 1410 SLP Time Calculation (min) (ACUTE ONLY): 25 min  Past Medical History:  Past Medical History  Diagnosis Date  .  Intracerebral hemorrhage   . Bradycardia   . Personal history of other disorders of nervous system and sense organs     seizure   . Syncope and collapse   . Cerebral aneurysm, nonruptured   . HTN (hypertension)   . Dementia    Past Surgical History:  Past Surgical History  Procedure Laterality Date  . Colonoscopy      x2. one w/hot biopsy. one w/ polypectomy  . Cerebral aneurysm repair      s/p surgery  . Ventriculoperitoneal shunt     HPI:  HPI: 80 y.o. male, who has dementia, ICH, HTN, wheelchair bound admitted  from [facility] with decreased mental status and apnea.Per MD note, pt  normally eats a pureed diet but yesterday successfully ate regular ham for  Easter dinner with the family.Head CT suspect recent/acute infarct in the  medial right occipital lobe. A second infarct is noted in the posterior  right cerebellum which may  also be recent/acute. Prior infarcts in the  supratentorial region appear stable compared to prior study as does a  prior infarct in the inferior left cerebellum.CXR No acute abnormality  seen.  No Data Recorded  Assessment / Plan / Recommendation CHL IP CLINICAL IMPRESSIONS 04/02/2014  Dysphagia Diagnosis Moderate oral phase dysphagia;Moderate pharyngeal  phase dysphagia  Clinical impression Pt exhibited moderately delayed manipulation with  holding and delayed transit. Moderate sensorimotor phayrngeal dysphagia  with reduced sensation resulting in delayed swallow initiation, reduced  tongue base retraction leading to max vallecular residue and sensed  aspiration due to poor sensation and motor deficits. PO's not recommended  with consideration of alternative short term nutrition (2-4 days).  Prognosis good for repeat MBS when appropriate.       CHL IP TREATMENT RECOMMENDATION 04/02/2014  Treatment Plan Recommendations Therapy as outlined in treatment plan below      CHL IP DIET RECOMMENDATION 04/02/2014  Diet Recommendations NPO  Liquid Administration via (None)  Medication Administration Via alternative means  Compensations (None)  Postural Changes and/or Swallow Maneuvers (None)     CHL IP OTHER RECOMMENDATIONS 04/02/2014  Recommended Consults (None)  Oral Care Recommendations (No Data)  Other Recommendations (None)     CHL IP FOLLOW UP RECOMMENDATIONS 04/02/2014  Follow up Recommendations Skilled Nursing facility     [facility] IP FREQUENCY AND DURATION 04/02/2014  Speech Therapy Frequency (ACUTE ONLY) min 2x/week  Treatment Duration 2 weeks     Pertinent Vitals/Pain none    SLP Swallow Goals No flowsheet data found.  No flowsheet data found.    CHL IP REASON FOR REFERRAL 04/02/2014  Reason for Referral Objectively evaluate swallowing function     CHL IP ORAL PHASE 04/02/2014  Lips (None)  Tongue (None)  Mucous membranes (None)  Nutritional status (None)  Other (None)  Oxygen therapy (None)  Oral Phase Impaired  Oral -  Pudding Teaspoon (None)  Oral - Pudding Cup (None)  Oral - Honey Teaspoon Delayed oral transit;Holding of bolus;Reduced  posterior propulsion;Weak lingual manipulation  Oral - Honey Cup Delayed oral transit;Holding of bolus;Reduced posterior  propulsion;Weak lingual manipulation  Oral - Honey Syringe (None)  Oral - Nectar Teaspoon (None)  Oral - Nectar Cup (None)  Oral - Nectar Straw (None)  Oral - Nectar Syringe (None)  Oral - Ice Chips (None)  Oral - Thin Teaspoon (None)  Oral - Thin Cup (None)  Oral - Thin Straw (None)  Oral - Thin Syringe (None)  Oral - Puree Delayed oral transit;Holding of bolus;Reduced posterior  propulsion;Weak lingual manipulation  Oral - Mechanical Soft (None)  Oral - Regular (None)  Oral - Multi-consistency (None)  Oral - Pill (None)  Oral Phase - Comment (None)      CHL IP PHARYNGEAL PHASE 04/02/2014  Pharyngeal Phase Impaired  Pharyngeal - Pudding Teaspoon (None)  Penetration/Aspiration details (pudding teaspoon) (None)  Pharyngeal - Pudding Cup (None)  Penetration/Aspiration details (pudding cup) (None)  Pharyngeal - Honey Teaspoon Penetration/Aspiration during  swallow;Pharyngeal residue - valleculae;Reduced laryngeal  elevation;Reduced airway/laryngeal closure;Reduced tongue base retraction  Penetration/Aspiration details (honey teaspoon) Material enters airway,  passes BELOW cords and not ejected out despite cough attempt by patient  Pharyngeal - Honey Cup Premature spillage to pyriform sinuses;Pharyngeal  residue - valleculae;Reduced tongue base retraction  Penetration/Aspiration details (honey cup) (None)  Pharyngeal - Honey Syringe (None)  Penetration/Aspiration details (honey syringe) (None)  Pharyngeal - Nectar Teaspoon (None)  Penetration/Aspiration details (nectar teaspoon) (None)  Pharyngeal - Nectar Cup (None)  Penetration/Aspiration details (nectar cup) (None)  Pharyngeal - Nectar Straw (None)  Penetration/Aspiration details (nectar straw) (None)  Pharyngeal - Nectar Syringe  (None)  Penetration/Aspiration details (nectar syringe) (None)  Pharyngeal - Ice Chips (None)  Penetration/Aspiration details (ice chips) (None)  Pharyngeal - Thin Teaspoon (None)  Penetration/Aspiration details (thin teaspoon) (None)  Pharyngeal - Thin Cup (None)  Penetration/Aspiration details (thin cup) (None)  Pharyngeal - Thin Straw (None)  Penetration/Aspiration details (thin straw) (None)  Pharyngeal - Thin Syringe (None)  Penetration/Aspiration details (thin syringe') (None)  Pharyngeal - Puree Delayed swallow initiation;Pharyngeal residue -  valleculae;Reduced tongue base retraction;Premature spillage to pyriform  sinuses  Penetration/Aspiration details (puree) (None)  Pharyngeal - Mechanical Soft (None)  Penetration/Aspiration details (mechanical soft) (None)  Pharyngeal - Regular (None)  Penetration/Aspiration details (regular) (None)  Pharyngeal - Multi-consistency (None)  Penetration/Aspiration details (multi-consistency) (None)  Pharyngeal - Pill (None)  Penetration/Aspiration details (pill) (None)  Pharyngeal Comment (None)     CHL IP CERVICAL ESOPHAGEAL PHASE 04/02/2014  Cervical Esophageal Phase WFL  Pudding Teaspoon (None)  Pudding Cup (None)  Honey Teaspoon (None)  Honey Cup (None)  Honey Syringe (None)  Nectar Teaspoon (None)  Nectar Cup (None)  Nectar Straw (None)  Nectar Syringe (None)  Thin Teaspoon (None)  Thin Cup (None)  Thin Straw (None)  Thin Syringe (None)  Cervical Esophageal Comment (None)    No flowsheet data found.         Royce Macadamia 04/02/2014, 3:41 PM   Breck Coons Lonell Face.Ed CCC-SLP Pager [phone]       Scheduled Meds: . aspirin  300 mg Rectal Daily   Or  . aspirin  325 mg Oral Daily  . heparin  5,000 Units Subcutaneous 3 times per Wooden  . levETIRAcetam  500 mg Intravenous Q12H  . metoprolol  2.5 mg Intravenous 3 times per [place]  . sodium chloride  500 mL Intravenous Once   Continuous Infusions: . sodium chloride 0.9 % 1,000 mL infusion      Principal  Problem:   Stroke Active Problems:   Essential hypertension   Bradycardia   Intracerebral hemorrhage   Dementia   Elevated serum creatinine   Apnea   Lactic acidosis    Time spent: 40 mins    [facility] MD [facility] Pager [phone]. If 7PM-7AM, please contact night-coverage at [email or web address], password [facility] 04/02/2014, 6:35 PM  LOS: 1 [place]

## 2014-04-02 NOTE — Progress Notes (Signed)
UR complete.  Kortnie Stovall RN, MSN 

## 2014-04-02 NOTE — Clinical Social Work Psychosocial (Signed)
Clinical Social Work Department BRIEF PSYCHOSOCIAL ASSESSMENT 04/02/2014  Patient:  Ian Sellers,Ian Sellers     Account Number:  [id number]     Admit date:  04/01/2014  Clinical Social Worker:  ,, LCSWA  Date/Time:  04/02/2014 02:44 PM  Referred by:  RN  Date Referred:  04/02/2014 Referred for  SNF Placement   Other Referral:   Interview type:  Family Other interview type:    PSYCHOSOCIAL DATA Living Status:  FACILITY Admitted from facility:  [facility] [place] Level of care:  Skilled Nursing Facility Primary support name:  Lazetta Dubs Primary support relationship to patient:  SPOUSE Degree of support available:   Strong Support    CURRENT CONCERNS Current Concerns  None Noted   Other Concerns:    SOCIAL WORK ASSESSMENT / PLAN CSW met the pt and his wife Lazetta at the bedside.  CSW introduced self and purpose of the visit. Lazetta confirmed the pt is a long-term care resident at [facility]. Lazetta reported that she placed a bed hold for the pt. CSW called Heather at [facility] to confirm.  CSW answered all questions in which the Lazetta inquired about. CSW provided Lazetta with contact information for further questions. CSW will continue to follow this pt and assist with discharge as needed.   Assessment/plan status:  Psychosocial Support/Ongoing Assessment of Needs Other assessment/ plan:   Information/referral to community resources:    PATIENT'S/FAMILY'S RESPONSE TO CURRENT DIAGNOSE: Lazetta presented with a flat affect, but calm mood. Lazetta was guarded and did not dicussed the pt current diagnose. Lazetta did place emphasize on wanting the pt to return back to [facility].    PATIENT'S/FAMILY'S RESPONSE TO PLAN OF CARE: Lazetta is in an agreement to the plan of care.    , MSW, LCSWA [phone]     

## 2014-04-02 NOTE — Progress Notes (Signed)
Patient refused to wear CPAP. RT will continue to monitor 

## 2014-04-02 NOTE — Progress Notes (Addendum)
Speech Pathology   MBSS complete. Full report located under chart review in imaging section (click on swallow function).  Pt exhibited moderately delayed oral manipulation with holding and delayed transit to posterior oral cavity. Moderate sensorimotor phayrngeal dysphagia with reduced sensation resulting in delayed swallow initiation, reduced tongue base retraction leading to max vallecular residue and sensed aspiration due to poor sensation and motor deficits. Compensatoy strategy of effortful swallow ineffective to clear residue. PO's not recommended with consideration of alternative short term nutrition (2-4 days). Prognosis good for repeat MBS when appropriate.    Breck CoonsLisa Willis JamestownLitaker M.Ed [employer]-SLP Pager [phone]

## 2014-04-02 NOTE — Progress Notes (Signed)
RT Note:  Wife at bedside.  Refused CPAP. Patient does not wear this at SNF.

## 2014-04-02 NOTE — Evaluation (Signed)
Clinical/Bedside Swallow Evaluation Patient Details  Name: Ian Sellers MRN: [id number] Date of Birth: 03/21/1934  Today's Date: 04/02/2014 Time: SLP Start Time (ACUTE ONLY): 1113 SLP Stop Time (ACUTE ONLY): 1135 SLP Time Calculation (min) (ACUTE ONLY): 22 min  Past Medical History:  Past Medical History  Diagnosis Date  . Intracerebral hemorrhage   . Bradycardia   . Personal history of other disorders of nervous system and sense organs     seizure   . Syncope and collapse   . Cerebral aneurysm, nonruptured   . HTN (hypertension)   . Dementia    Past Surgical History:  Past Surgical History  Procedure Laterality Date  . Colonoscopy      x2. one w/hot biopsy. one w/ polypectomy  . Cerebral aneurysm repair      s/p surgery  . Ventriculoperitoneal shunt     HPI:  80 y.o. male, who has dementia, ICH, HTN, wheelchair bound admitted from [facility] with decreased mental status and apnea.Per MD note, pt normally eats a pureed diet but yesterday successfully ate regular ham for Easter dinner with the family.Head CT suspect recent/acute infarct in the medial right occipital lobe. A second infarct is noted in the posterior right cerebellum which may also be recent/acute. Prior infarcts in the supratentorial region appear stable compared to prior study as does a prior infarct in the inferior left cerebellum.CXR No acute abnormality seen.   Assessment / Plan / Recommendation Clinical Impression  Pt would benefit from objective swallow study with MBS due to pharygneal impairments and increased suspicion of silent aspriration. Adequate exhalation present post swallow with consistent increased work of breathing following po trials. Increased risk with prior ICH, dementia, acute infarct and presence of dried soft palate secretions. MBS scheduled for 1330 today.     Aspiration Risk  Severe    Diet Recommendation NPO        Other  Recommendations Recommended Consults: MBS Oral Care  Recommendations:  (per protocol)   Follow Up Recommendations   (TBD)    Frequency and Duration        Pertinent Vitals/Pain Abdominal pain after po's relieved with repositioning         Swallow Study       Oral/Motor/Sensory Function Overall Oral Motor/Sensory Function:  (generalized decreased strength)   Ice Chips Ice chips: Impaired Presentation: Spoon Oral Phase Impairments: Reduced lingual movement/coordination Oral Phase Functional Implications: Prolonged oral transit Pharyngeal Phase Impairments: Suspected delayed Swallow   Thin Liquid Thin Liquid: Impaired Presentation: Cup;Spoon Oral Phase Impairments: Reduced labial seal Oral Phase Functional Implications: Right anterior spillage Pharyngeal  Phase Impairments: Suspected delayed Swallow    Nectar Thick Nectar Thick Liquid: Not tested   Honey Thick Honey Thick Liquid: Not tested   Puree Puree: Impaired Presentation: Spoon Oral Phase Impairments: Reduced lingual movement/coordination Pharyngeal Phase Impairments: Suspected delayed Swallow   Solid   GO    Solid: Not tested       Royce MacadamiaLitaker, Shelbey Spindler Willis 04/02/2014,11:47 AM  Breck CoonsLisa Willis Lonell FaceLitaker M.Ed [employer]-SL[employer] Pager [phone]

## 2014-04-03 DIAGNOSIS — Z66 Do not resuscitate: Secondary | ICD-10-CM

## 2014-04-03 DIAGNOSIS — Z515 Encounter for palliative care: Secondary | ICD-10-CM

## 2014-04-03 DIAGNOSIS — R1314 Dysphagia, pharyngoesophageal phase: Secondary | ICD-10-CM

## 2014-04-03 LAB — GLUCOSE, CAPILLARY
Glucose-Capillary: 95 mg/dL (ref 70–99)
Glucose-Capillary: 100 mg/dL — ABNORMAL HIGH (ref 70–99)
Glucose-Capillary: 104 mg/dL — ABNORMAL HIGH (ref 70–99)

## 2014-04-03 LAB — BASIC METABOLIC PANEL
Anion gap: 9 (ref 5–15)
BUN: 27 mg/dL — AB (ref 6–23)
CO2: 19 mmol/L (ref 19–32)
Calcium: 8.8 mg/dL (ref 8.4–10.5)
Chloride: 118 mmol/L — ABNORMAL HIGH (ref 96–112)
Creatinine, Ser: 1.07 mg/dL (ref 0.50–1.35)
GFR calc Af Amer: 75 mL/min — ABNORMAL LOW (ref >90)
GFR calc non Af Amer: 65 mL/min — ABNORMAL LOW (ref >90)
GLUCOSE: 99 mg/dL (ref 70–99)
Potassium: 3.7 mmol/L (ref 3.5–5.1)
Sodium: 146 mmol/L — ABNORMAL HIGH (ref 135–145)

## 2014-04-03 LAB — HEMOGLOBIN A1C
Hgb A1c MFr Bld: 5.5 % (ref 4.8–5.6)
Mean Plasma Glucose: 111 mg/dL

## 2014-04-03 LAB — URINE CULTURE: Colony Count: 75000

## 2014-04-03 NOTE — Progress Notes (Signed)
Speech Language Pathology Treatment: Dysphagia  Patient Details Name: Ian Sellers MRN: [id number] DOB: 08/23/1934 Today's Date: 04/03/2014 Time: 1012-1037 SLP Time Calculation (min) (ACUTE ONLY): 25 min  Assessment / Plan / Recommendation Clinical Impression  Skilled treatment session focused on addressing dysphagia goals with focus of session on education and implementation of pharyngeal strengthening exercises.  Wife reported being present for yesterday's MBS; however, SLP re-educated her on findings, recommendations and need for therapy due to her asking if husband would have another MBS today.  SLP emphasized need to address pharyngeal/swallow strengthening prior to repeat study.  SLP facilitated session with Max-Total assist to complete oral care via suctioning.  SLP demonstrated Masako Maneuver and hard effortful swallows and provided Max multimodal cues for patient to complete 2-3 times with rest breaks.  Wife observed and demonstrated the ability to cue patient; as a result, SLP left handout and encouraged her to complete exercises with patient throughout the [place].     HPI HPI: 80 y.o. male, who has dementia, ICH, HTN, wheelchair bound admitted from [facility] with decreased mental status and apnea.Per MD note, pt normally eats a pureed diet but yesterday successfully ate regular ham for Easter dinner with the family.Head CT suspect recent/acute infarct in the medial right occipital lobe. A second infarct is noted in the posterior right cerebellum which may also be recent/acute. Prior infarcts in the supratentorial region appear stable compared to prior study as does a prior infarct in the inferior left cerebellum.CXR No acute abnormality seen.   Pertinent Vitals Pain Assessment: No/denies pain  SLP Plan  Continue with current plan of care    Recommendations Diet recommendations: NPO Medication Administration: Via alternative means              Oral Care Recommendations: Oral care Q4  per protocol Follow up Recommendations: Skilled Nursing facility;24 hour supervision/assistance Plan: Continue with current plan of care    GO    Ian Sellers, M.A., CCC-SLP [phone]  Ian Sellers 04/03/2014, 11:47 AM

## 2014-04-03 NOTE — Progress Notes (Signed)
[facility] PROGRESS NOTE  Ian IraniDavid M Sellers WUJ:[id number] DOB: 02/23/1934 DOA: 04/01/2014 PCP: [name],Ian M, MD INTERIM SUMMARY: [age] y. Old male admitted for acute CVA.   Assessment/Plan: #1 acute CVA Evident on the CT head. his 2-D echo does not show any source of emboli. Patient's wife did not want an extensive workup including MRI which was canceled. Patient was on aspirin daily. Patient has been recommended to change from aspirin to Plavix per neurology recommendations. Once able to take orals will need to switch aspirin to Plavix. Patient failed modified barium swallow and is currently nothing by mouth. Patient failed swalow eval and NPO. Palliative care consultation requested for goals of care and is scheduled for this afternoon. Once patient diet has been addressed could possibly be discharged to [facility] facility with palliative care/hospice following.  #2 apnea Likely secondary to obstructive sleep apnea. Cpap daily at bedtime. Continue O2 as needed.  #3 elevated serum creatinine Likely secondary to prerenal azotemia. Baseline creatinine is around 1.  Improved with hydration.  #4 hypertension Patient currently nothing by mouth and i son IV anti hypertensive medications.  #5 dementia Stable.  #6 palliative medicine Family wants patient to be kept comfortable with no extensive workup. Palliative care consultation pending for goals of care.  #7 prophylaxis Heparin for DVT prophylaxis.   Code Status: DO NOT RESUSCITATE Family Communication: Updated wife at bedside. Disposition Plan: pending palliative care consult.    Consultants:  Neurology: Dr. Thad Sellers 04/01/2014  Procedures:  CT head 04/01/2014  Modified barium swallow 3/29 2016  Chest x-ray 04/01/2014  2-D echo 04/02/2014  Antibiotics:  None  HPI/Subjective: Patient appears comfortable. No new complaints.   Objective: Filed Vitals:   04/03/14 0932  BP: 191/80  Pulse: 88  Temp: 98.9 F  (37.2 C)  Resp: 16    Intake/Output Summary (Last 24 hours) at 04/03/14 1554 Last data filed at 04/03/14 0524  Gross per 24 hour  Intake   12.5 ml  Output    350 ml  Net -337.5 ml   There were no vitals filed for this visit.  Exam:   General:  nad  Cardiovascular: RRR  Respiratory: CTAB anterior lung fields  Abdomen: Soft, nontender, nondistended, positive bowel sounds.  Musculoskeletal: No clubbing cyanosis or edema.  Data Reviewed: Basic Metabolic Panel:  Recent Labs Lab 04/01/14 0748 04/01/14 1357 04/03/14 0521  NA 143  --  146*  K 4.1  --  3.7  CL 111  --  118*  CO2 22  --  19  GLUCOSE 168*  --  99  BUN 36*  --  27*  CREATININE 1.37* 1.10 1.07  CALCIUM 8.9  --  8.8   Liver Function Tests:  Recent Labs Lab 04/01/14 0748  AST 54*  ALT 52  ALKPHOS 66  BILITOT 0.5  PROT 5.5*  ALBUMIN 3.2*   No results for input(s): LIPASE, AMYLASE in the last 168 hours. No results for input(s): AMMONIA in the last 168 hours. CBC:  Recent Labs Lab 04/01/14 0748  WBC 10.4  NEUTROABS 9.3*  HGB 11.6*  HCT 34.2*  MCV 92.4  PLT 207   Cardiac Enzymes: No results for input(s): CKTOTAL, CKMB, CKMBINDEX, TROPONINI in the last 168 hours. BNP (last 3 results) No results for input(s): BNP in the last 8760 hours.  ProBNP (last 3 results) No results for input(s): PROBNP in the last 8760 hours.  CBG:  Recent Labs Lab 04/02/14 1141 04/02/14 1928 04/02/14 2353 04/03/14 [id number] 04/03/14 1135  GLUCAP 110* 99 100* 95 100*    Recent Results (from the past 240 hour(s))  Urine culture     Status: None   Collection Time: 04/01/14  8:43 AM  Result Value Ref Range Status   Specimen Description URINE, CATHETERIZED  Final   Special Requests NONE  Final   Colony Count   Final    75,000 COLONIES/ML Performed at [facility]    Culture   Final    VIRIDANS STREPTOCOCCUS Performed at [facility]    Report Status 04/03/2014 FINAL  Final      Studies: Dg Swallowing Func-speech Pathology  04/02/2014    Objective Swallowing Evaluation:   Modified Barium Swallow Patient Details  Name: Ian Sellers MRN: [id number] Date of Birth: 05/27/1934  Today's Date: 04/02/2014 Time: SLP Start Time (ACUTE ONLY): 1345-SLP Stop Time (ACUTE ONLY): 1410 SLP Time Calculation (min) (ACUTE ONLY): 25 min  Past Medical History:  Past Medical History  Diagnosis Date  . Intracerebral hemorrhage   . Bradycardia   . Personal history of other disorders of nervous system and sense organs     seizure   . Syncope and collapse   . Cerebral aneurysm, nonruptured   . HTN (hypertension)   . Dementia    Past Surgical History:  Past Surgical History  Procedure Laterality Date  . Colonoscopy      x2. one w/hot biopsy. one w/ polypectomy  . Cerebral aneurysm repair      s/p surgery  . Ventriculoperitoneal shunt     HPI:  HPI: 80 y.o. male, who has dementia, ICH, HTN, wheelchair bound admitted  from [facility] with decreased mental status and apnea.Per MD note, pt  normally eats a pureed diet but yesterday successfully ate regular ham for  Easter dinner with the family.Head CT suspect recent/acute infarct in the  medial right occipital lobe. A second infarct is noted in the posterior  right cerebellum which may also be recent/acute. Prior infarcts in the  supratentorial region appear stable compared to prior study as does a  prior infarct in the inferior left cerebellum.CXR No acute abnormality  seen.  No Data Recorded  Assessment / Plan / Recommendation CHL IP CLINICAL IMPRESSIONS 04/02/2014  Dysphagia Diagnosis Moderate oral phase dysphagia;Moderate pharyngeal  phase dysphagia  Clinical impression Pt exhibited moderately delayed manipulation with  holding and delayed transit. Moderate sensorimotor phayrngeal dysphagia  with reduced sensation resulting in delayed swallow initiation, reduced  tongue base retraction leading to max vallecular residue and sensed  aspiration due to poor sensation  and motor deficits. PO's not recommended  with consideration of alternative short term nutrition (2-4 days).  Prognosis good for repeat MBS when appropriate.       CHL IP TREATMENT RECOMMENDATION 04/02/2014  Treatment Plan Recommendations Therapy as outlined in treatment plan below      CHL IP DIET RECOMMENDATION 04/02/2014  Diet Recommendations NPO  Liquid Administration via (None)  Medication Administration Via alternative means  Compensations (None)  Postural Changes and/or Swallow Maneuvers (None)     CHL IP OTHER RECOMMENDATIONS 04/02/2014  Recommended Consults (None)  Oral Care Recommendations (No Data)  Other Recommendations (None)     [facility] IP FOLLOW UP RECOMMENDATIONS 04/02/2014  Follow up Recommendations Skilled Nursing facility     [facility] IP FREQUENCY AND DURATION 04/02/2014  Speech Therapy Frequency (ACUTE ONLY) min 2x/week  Treatment Duration 2 weeks     Pertinent Vitals/Pain none    SLP Swallow Goals No flowsheet data found.  No flowsheet data found.    CHL IP REASON FOR REFERRAL 04/02/2014  Reason for Referral Objectively evaluate swallowing function     CHL IP ORAL PHASE 04/02/2014  Lips (None)  Tongue (None)  Mucous membranes (None)  Nutritional status (None)  Other (None)  Oxygen therapy (None)  Oral Phase Impaired  Oral - Pudding Teaspoon (None)  Oral - Pudding Cup (None)  Oral - Honey Teaspoon Delayed oral transit;Holding of bolus;Reduced  posterior propulsion;Weak lingual manipulation  Oral - Honey Cup Delayed oral transit;Holding of bolus;Reduced posterior  propulsion;Weak lingual manipulation  Oral - Honey Syringe (None)  Oral - Nectar Teaspoon (None)  Oral - Nectar Cup (None)  Oral - Nectar Straw (None)  Oral - Nectar Syringe (None)  Oral - Ice Chips (None)  Oral - Thin Teaspoon (None)  Oral - Thin Cup (None)  Oral - Thin Straw (None)  Oral - Thin Syringe (None)  Oral - Puree Delayed oral transit;Holding of bolus;Reduced posterior  propulsion;Weak lingual manipulation  Oral - Mechanical Soft (None)  Oral  - Regular (None)  Oral - Multi-consistency (None)  Oral - Pill (None)  Oral Phase - Comment (None)      CHL IP PHARYNGEAL PHASE 04/02/2014  Pharyngeal Phase Impaired  Pharyngeal - Pudding Teaspoon (None)  Penetration/Aspiration details (pudding teaspoon) (None)  Pharyngeal - Pudding Cup (None)  Penetration/Aspiration details (pudding cup) (None)  Pharyngeal - Honey Teaspoon Penetration/Aspiration during  swallow;Pharyngeal residue - valleculae;Reduced laryngeal  elevation;Reduced airway/laryngeal closure;Reduced tongue base retraction  Penetration/Aspiration details (honey teaspoon) Material enters airway,  passes BELOW cords and not ejected out despite cough attempt by patient  Pharyngeal - Honey Cup Premature spillage to pyriform sinuses;Pharyngeal  residue - valleculae;Reduced tongue base retraction  Penetration/Aspiration details (honey cup) (None)  Pharyngeal - Honey Syringe (None)  Penetration/Aspiration details (honey syringe) (None)  Pharyngeal - Nectar Teaspoon (None)  Penetration/Aspiration details (nectar teaspoon) (None)  Pharyngeal - Nectar Cup (None)  Penetration/Aspiration details (nectar cup) (None)  Pharyngeal - Nectar Straw (None)  Penetration/Aspiration details (nectar straw) (None)  Pharyngeal - Nectar Syringe (None)  Penetration/Aspiration details (nectar syringe) (None)  Pharyngeal - Ice Chips (None)  Penetration/Aspiration details (ice chips) (None)  Pharyngeal - Thin Teaspoon (None)  Penetration/Aspiration details (thin teaspoon) (None)  Pharyngeal - Thin Cup (None)  Penetration/Aspiration details (thin cup) (None)  Pharyngeal - Thin Straw (None)  Penetration/Aspiration details (thin straw) (None)  Pharyngeal - Thin Syringe (None)  Penetration/Aspiration details (thin syringe') (None)  Pharyngeal - Puree Delayed swallow initiation;Pharyngeal residue -  valleculae;Reduced tongue base retraction;Premature spillage to pyriform  sinuses  Penetration/Aspiration details (puree) (None)  Pharyngeal -  Mechanical Soft (None)  Penetration/Aspiration details (mechanical soft) (None)  Pharyngeal - Regular (None)  Penetration/Aspiration details (regular) (None)  Pharyngeal - Multi-consistency (None)  Penetration/Aspiration details (multi-consistency) (None)  Pharyngeal - Pill (None)  Penetration/Aspiration details (pill) (None)  Pharyngeal Comment (None)     CHL IP CERVICAL ESOPHAGEAL PHASE 04/02/2014  Cervical Esophageal Phase WFL  Pudding Teaspoon (None)  Pudding Cup (None)  Honey Teaspoon (None)  Honey Cup (None)  Honey Syringe (None)  Nectar Teaspoon (None)  Nectar Cup (None)  Nectar Straw (None)  Nectar Syringe (None)  Thin Teaspoon (None)  Thin Cup (None)  Thin Straw (None)  Thin Syringe (None)  Cervical Esophageal Comment (None)    No flowsheet data found.         Ian Sellers 04/02/2014, 3:41 PM   Ian Sellers.Ed [employer] [phone]  Scheduled Meds: . aspirin  300 mg Rectal Daily   Or  . aspirin  325 mg Oral Daily  . heparin  5,000 Units Subcutaneous 3 times per [place]  . levETIRAcetam  500 mg Intravenous Q12H  . metoprolol  2.5 mg Intravenous 3 times per [place]  . sodium chloride  500 mL Intravenous Once   Continuous Infusions: . sodium chloride 0.9 % 1,000 mL infusion 75 mL/hr at 04/03/14 1509    Principal Problem:   Stroke Active Problems:   Essential hypertension   Bradycardia   Intracerebral hemorrhage   Dementia   Elevated serum creatinine   Apnea   Lactic acidosis   Palliative care encounter   DNR (do not resuscitate)    Time spent: 40 mins    Kaisley Stiverson MD [facility] Pager [phone]. If 7PM-7AM, please contact night-coverage at [email or web address], password [email or web address] 04/03/2014, 3:54 PM  LOS: 2 days

## 2014-04-03 NOTE — Progress Notes (Signed)
OT Cancellation Note  Patient Details Name: Ian IraniDavid M Sellers MRN: [id number] DOB: 06/10/1934   Cancelled Treatment:    Reason Eval/Treat Not Completed: Other (comment) Pt coming from nursing home at max A with ADLs and plan is to return to nursing home at d/c. Deferring OT needs to next venue.  Ian GrammesMathews, Ian Sellers 04/03/2014, 10:57 AM

## 2014-04-03 NOTE — Consult Note (Signed)
Consultation Note Date: 04/03/2014   Patient Name: Ian Sellers  DOB: 11/02/1934  MRN: [id number]  Age / Sex: 80 y.o., male   PCP: Everlean Cherryhomas M Whyte, MD Referring Physician: Kathlen ModyVijaya Akula, MD  Reason for Consultation: Disposition, Establishing goals of care, Non pain symptom management, Pain control and Psychosocial/spiritual support  WUJ:[id number]:Ian Sellers is a 80 y.o. male, who has dementia and is wheelchair bound. He lives in [facility]. His family is very supportive and interact with him daily. 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 He was sent to the ER this morning for decreased mental status and apnea. The family states he has been doing well recently. He has been on CPAP and oxygen in the past but no longer needs it. He normally eats a pureed diet but yesterday successfully ate regular ham for Easter dinner with the family. He has had no recent illness, has been conversant, and normally paddles around [place] in his wheelchair. The family notes that today he is excessively sleepy and has been snoring very loudly. At times he will simply stop breathing for several seconds.  In the ER Ian Sellers is found to have two new strokes since his last CT head. Radiology notes that they are "recent/acute". The patient's creatinine is slightly elevated compared to baseline, his U/A has a few bacteria. He is lethargic and will only wake for a few seconds - but recognizes his wife and son. When I asked how he felt, he smiled and replied "lucky". Stat ABG is pending.  Palliative Review of Systems: Dyspnea: no Cough: no Nutritional status( weight change, appetite, taste disturbance): yes Oral Symptoms (xerostomia, dysphagia, odynophagia): yes Nausea/Vomiting: no Constipation/diarrhea: no  Urination problems: no Sleep: no Fatigue: no Sedation: no Cognitive/ memory problems: yes Anxiety: no Depression: no Concerns/worries: yes Other:  no  Contacts/Participants in Discussion: Primary Decision Maker: Wife/ Ian Sellers    HCPOA: yes /by default   Advance Directive: @ADVDIR @  <no information>   I have reviewed the medical record, interviewed the patient and family, and examined the patient. The following aspects are pertinent.  Past Medical History  Diagnosis Date  . Intracerebral hemorrhage   . Bradycardia   . Personal history of other disorders of nervous system and sense organs     seizure   . Syncope and collapse   . Cerebral aneurysm, nonruptured   . HTN (hypertension)   . Dementia    History   Social History  . Marital Status: Married    Spouse Name: N/A  . Number of Children: N/A  . Years of Education: N/A   Social History Main Topics  . Smoking status: Current Every [place] Smoker -- 0.50 packs/[place]  . Smokeless tobacco: Not on file  . Alcohol Use: No  . Drug Use: No  . Sexual Activity: Not on file   Other Topics Concern  . None   Social History Narrative   Married, lives with wife; independent of activities of daily living.    No family history on file. Scheduled Meds: . aspirin  300 mg Rectal Daily   Or  . aspirin  325 mg Oral Daily  . heparin  5,000 Units Subcutaneous 3 times per [place]  . levETIRAcetam  500 mg Intravenous Q12H  . metoprolol  2.5 mg Intravenous 3 times per [place]  . sodium chloride  500 mL Intravenous Once   Continuous Infusions: . sodium chloride 0.9 % 1,000 mL infusion 75  mL/hr at 04/02/14 1850   PRN Meds:.bisacodyl, haloperidol lactate, hydrALAZINE, LORazepam Medications Prior to Admission:  Prior to Admission medications   Medication Sig Start Date End Date Taking? Authorizing Provider  alfuzosin (UROXATRAL) 10 MG 24 hr tablet Take 10 mg by mouth daily with breakfast.   Yes Historical Provider, MD  Amino Acids-Protein Hydrolys (FEEDING SUPPLEMENT, PRO-STAT SUGAR FREE 64,) LIQD Take 30 mLs by mouth 3 (three) times daily.   Yes Historical Provider, MD  amLODipine  (NORVASC) 10 MG tablet Take 10 mg by mouth every morning.    Yes Historical Provider, MD  aspirin 81 MG chewable tablet Chew 81 mg by mouth every morning.   Yes Historical Provider, MD  Calcium Carbonate-Vitamin D (CALCIUM 600 + D PO) Take 1 tablet by mouth daily.   Yes Historical Provider, MD  Cholecalciferol (VITAMIN D3) 2000 UNITS TABS Take 2,000 Units by mouth 2 (two) times daily.   Yes Historical Provider, MD  diphenhydrAMINE (BENADRYL) 25 mg capsule Take 25 mg by mouth 2 (two) times daily as needed for itching.   Yes Historical Provider, MD  isosorbide mononitrate (IMDUR) 30 MG 24 hr tablet Take 30 mg by mouth daily.   Yes Historical Provider, MD  lamoTRIgine (LAMICTAL) 100 MG tablet Take 100 mg by mouth daily.   Yes Historical Provider, MD  levETIRAcetam (KEPPRA) 500 MG tablet Take 500 mg by mouth 2 (two) times daily.   Yes Historical Provider, MD  loratadine (CLARITIN) 10 MG tablet Take 10 mg by mouth daily.   Yes Historical Provider, MD  Multiple Vitamin (MULTIVITAMIN) tablet Take 1 tablet by mouth every morning.    Yes Historical Provider, MD  Nutritional Supplements (ENSURE PO) Take 1 Can by mouth 2 (two) times daily. With lunch and supper   Yes Historical Provider, MD  oxyCODONE (OXY IR/ROXICODONE) 5 MG immediate release tablet Take 5 mg by mouth every 12 (twelve) hours.    Yes Historical Provider, MD  polyethylene glycol (MIRALAX / GLYCOLAX) packet Take 17 g by mouth daily.   Yes Historical Provider, MD  QUEtiapine (SEROQUEL) 50 MG tablet Take 50 mg by mouth at bedtime.   Yes Historical Provider, MD  trihexyphenidyl (ARTANE) 5 MG tablet Take 2.5 mg by mouth 2 (two) times daily.   Yes Historical Provider, MD  valsartan (DIOVAN) 160 MG tablet Take 160 mg by mouth daily.    Yes Historical Provider, MD  zonisamide (ZONEGRAN) 100 MG capsule Take 100 mg by mouth 2 (two) times daily.   Yes Historical Provider, MD  carbamazepine (TEGRETOL) 200 MG tablet Take 200 mg by mouth at bedtime.     Historical Provider, MD  QUEtiapine (SEROQUEL) 25 MG tablet Take 75 mg by mouth at bedtime.     Historical Provider, MD   Allergies  Allergen Reactions  . Sulfa Antibiotics Other (See Comments)    Childhood allergy; reaction unknown   CBC:    Component Value Date/Time   WBC 10.4 04/01/2014 0748   HGB 11.6* 04/01/2014 0748   HCT 34.2* 04/01/2014 0748   PLT 207 04/01/2014 0748   MCV 92.4 04/01/2014 0748   NEUTROABS 9.3* 04/01/2014 0748   LYMPHSABS 0.5* 04/01/2014 0748   MONOABS 0.7 04/01/2014 0748   EOSABS 0.0 04/01/2014 0748   BASOSABS 0.0 04/01/2014 0748   Comprehensive Metabolic Panel:    Component Value Date/Time   NA 146* 04/03/2014 0521   K 3.7 04/03/2014 0521   CL 118* 04/03/2014 0521   CO2 19 04/03/2014 0521   BUN  27* 04/03/2014 0521   CREATININE 1.07 04/03/2014 0521   GLUCOSE 99 04/03/2014 0521   CALCIUM 8.8 04/03/2014 0521   AST 54* 04/01/2014 0748   ALT 52 04/01/2014 0748   ALKPHOS 66 04/01/2014 0748   BILITOT 0.5 04/01/2014 0748   PROT 5.5* 04/01/2014 0748   ALBUMIN 3.2* 04/01/2014 0748    Physical Exam: Vital Signs: BP 191/80 mmHg  Pulse 88  Temp(Src) 98.9 F (37.2 C) (Oral)  Resp 16  Ht 5' 8.4" (1.737 m)  SpO2 99% SpO2: SpO2: 99 % O2 Device: O2 Device: Nasal Cannula O2 Flow Rate: O2 Flow Rate (L/min): 2 L/min  Intake/output summary:  Intake/Output Summary (Last 24 hours) at 04/03/14 1456 Last data filed at 04/03/14 0524  Gross per 24 hour  Intake   12.5 ml  Output    800 ml  Net -787.5 ml    LBM:    Baseline Weight:   Most recent weight:    Physical Exam:             General Appearance:cachectic and mild distress, minimally resposnive             Heart: RRR             Respiratory: diminished breath sounds bilaterally             Abdomen: soft, nontender, nondistended, no masses or organomegaly.             Extremities: contracted Prior to Admission medications   Medication Sig Start Date End Date Taking? Authorizing Provider   alfuzosin (UROXATRAL) 10 MG 24 hr tablet Take 10 mg by mouth daily with breakfast.   Yes Historical Provider, MD  Amino Acids-Protein Hydrolys (FEEDING SUPPLEMENT, PRO-STAT SUGAR FREE 64,) LIQD Take 30 mLs by mouth 3 (three) times daily.   Yes Historical Provider, MD  amLODipine (NORVASC) 10 MG tablet Take 10 mg by mouth every morning.    Yes Historical Provider, MD  aspirin 81 MG chewable tablet Chew 81 mg by mouth every morning.   Yes Historical Provider, MD  Calcium Carbonate-Vitamin D (CALCIUM 600 + D PO) Take 1 tablet by mouth daily.   Yes Historical Provider, MD  Cholecalciferol (VITAMIN D3) 2000 UNITS TABS Take 2,000 Units by mouth 2 (two) times daily.   Yes Historical Provider, MD  diphenhydrAMINE (BENADRYL) 25 mg capsule Take 25 mg by mouth 2 (two) times daily as needed for itching.   Yes Historical Provider, MD  isosorbide mononitrate (IMDUR) 30 MG 24 hr tablet Take 30 mg by mouth daily.   Yes Historical Provider, MD  lamoTRIgine (LAMICTAL) 100 MG tablet Take 100 mg by mouth daily.   Yes Historical Provider, MD  levETIRAcetam (KEPPRA) 500 MG tablet Take 500 mg by mouth 2 (two) times daily.   Yes Historical Provider, MD  loratadine (CLARITIN) 10 MG tablet Take 10 mg by mouth daily.   Yes Historical Provider, MD  Multiple Vitamin (MULTIVITAMIN) tablet Take 1 tablet by mouth every morning.    Yes Historical Provider, MD  Nutritional Supplements (ENSURE PO) Take 1 Can by mouth 2 (two) times daily. With lunch and supper   Yes Historical Provider, MD  oxyCODONE (OXY IR/ROXICODONE) 5 MG immediate release tablet Take 5 mg by mouth every 12 (twelve) hours.    Yes Historical Provider, MD  polyethylene glycol (MIRALAX / GLYCOLAX) packet Take 17 g by mouth daily.   Yes Historical Provider, MD  QUEtiapine (SEROQUEL) 50 MG tablet Take 50 mg by mouth at bedtime.  Yes Historical Provider, MD  trihexyphenidyl (ARTANE) 5 MG tablet Take 2.5 mg by mouth 2 (two) times daily.   Yes Historical Provider, MD   valsartan (DIOVAN) 160 MG tablet Take 160 mg by mouth daily.    Yes Historical Provider, MD  zonisamide (ZONEGRAN) 100 MG capsule Take 100 mg by mouth 2 (two) times daily.   Yes Historical Provider, MD  carbamazepine (TEGRETOL) 200 MG tablet Take 200 mg by mouth at bedtime.    Historical Provider, MD  QUEtiapine (SEROQUEL) 25 MG tablet Take 75 mg by mouth at bedtime.     Historical Provider, MD               Neurologic: 5/5 strength bilateral upper and lower extremities              Additional Data Reviewed: Recent Labs     04/01/14  0748  04/01/14  1357  04/03/14  0521  WBC  10.4   --    --   HGB  11.6*   --    --   PLT  207   --    --   NA  143   --   146*  BUN  36*   --   27*  CREATININE  1.37*  1.10  1.07    4 - Bedbound   Palliative Care Assessment and Plan Summary of Established Goals of Care and Medical Treatment Preferences    Primary Diagnoses  Present on Admission:  . Stroke . Intracerebral hemorrhage . Essential hypertension . Dementia . Bradycardia . Elevated serum creatinine . Lactic acidosis  Prognosis: < 6 months  Psycho-social/Spiritual:   Know diagnosis: yes-family is aware  Knows prognosis: yes-family is aware  Patient's preference about sharing medical information: patient is unable to particiapte  Desire for further Chaplaincy support:yes  Hospitalizations in the last 6 months: one  Palliative Performance Scale: 20 % at best  Recommendations:  1. Code Status:     Code Status Orders        Start     Ordered   04/01/14 1214  Do not attempt resuscitation (DNR)   Continuous    Question Answer Comment  In the event of cardiac or respiratory ARREST Do not call a "code blue"   In the event of cardiac or respiratory ARREST Do not perform Intubation, CPR, defibrillation or ACLS   In the event of cardiac or respiratory ARREST Use medication by any route, position, wound care, and other measures to relive pain and suffering. May use  oxygen, suction and manual treatment of airway obstruction as needed for comfort.      04/01/14 1214    Advance Directive Documentation        Most Recent Value   Type of Advance Directive  Healthcare Power of Attorney   Pre-existing out of facility DNR order (yellow form or pink MOST form)     "MOST" Form in Place?        2.  Goals of care: For today continue with current medical treatment plan, hopeful for re-evaluation for swallow in the morning.   Family is considering a PEG tube for trial period of time in hopes of improvement.   3. Symptom Management:   Dysphagia-comfort feeds as tolerated with known risk for aspiration Weakness/ altered mental status   5. Disposition: Skilled Nursing Facility with Hospice   Time In: 1300 Time Out: 1430 Time Total: 1.5 hours Greater than 50%  of this time was spent  counseling and coordinating care related to the above assessment and plan.  Signed by: Lorinda Creed, NP  Canary Brim, NP  04/03/2014, 2:56 PM  Please contact Palliative Medicine Team phone at [phone] for questions and concerns.

## 2014-04-04 DIAGNOSIS — R1314 Dysphagia, pharyngoesophageal phase: Secondary | ICD-10-CM | POA: Insufficient documentation

## 2014-04-04 LAB — GLUCOSE, CAPILLARY
GLUCOSE-CAPILLARY: 88 mg/dL (ref 70–99)
Glucose-Capillary: 91 mg/dL (ref 70–99)
Glucose-Capillary: 95 mg/dL (ref 70–99)

## 2014-04-04 MED ORDER — BISACODYL 10 MG RE SUPP: 10.0000 mg | Freq: Every day | RECTAL | Status: AC | PRN

## 2014-04-04 MED ORDER — LEVETIRACETAM 100 MG/ML PO SOLN: 500.0000 mg | Freq: Two times a day (BID) | ORAL | Status: AC

## 2014-04-04 MED ORDER — CLOPIDOGREL BISULFATE 75 MG PO TABS: 75.0000 mg | ORAL_TABLET | Freq: Every day | ORAL | Status: AC

## 2014-04-04 NOTE — Discharge Summary (Signed)
Physician Discharge Summary  Ian Sellers UVO:[id number] DOB: Nov 09, 1934 DOA: 04/01/2014  PCP: Everlean Cherry, MD  Admit date: 04/01/2014 Discharge date: 04/04/2014  Time spent: 30  minutes  Recommendations for Outpatient Follow-up:  1. Please follow up with hospice care services at the facility  Discharge Diagnoses:  Principal Problem:   Stroke Active Problems:   Essential hypertension   Bradycardia   Intracerebral hemorrhage   Dementia   Elevated serum creatinine   Apnea   Lactic acidosis   Palliative care encounter   DNR (do not resuscitate)   Dysphagia, pharyngoesophageal phase    Diet recommendation: comfort feeds.   There were no vitals filed for this visit.  History of present illness:  80 y. Old male admitted for acute CVA.  Hospital Course:  #1 acute CVA Evident on the CT head. his 2-D echo does not show any source of emboli. Patient's wife did not want an extensive workup including MRI which was canceled. Patient was on aspirin daily. Patient has been recommended to change from aspirin to Plavix per neurology recommendations.  Patient failed modified barium swallow and is currently nothing by mouth. Patient failed swalow eval and NPO. Palliative care consultation requested for goals of care and family decided for comfort care and comfort feeds and plan to discharge to [facility] facility with hospice following.  #2 apnea Likely secondary to obstructive sleep apnea. Cpap daily at bedtime. Continue O2 as needed.  #3 elevated serum creatinine Likely secondary to prerenal azotemia. Baseline creatinine is around 1. Improved with hydration.  #4 hypertension Resume home meds .   #5 dementia Stable.  #6 palliative medicine Family wants patient to be kept comfortable with no extensive workup. Palliative care consultation done and plan for comfort care with comfort feeds.  Procedures:  CT head  Consultations:  Neurology  Palliative care   Discharge  Exam: Filed Vitals:   04/04/14 0940  BP: 180/77  Pulse: 90  Temp: 98.8 F (37.1 C)  Resp: 17    General: alert comfortable Cardiovascular: s1s2 Respiratory: diminished at bases.   Discharge Instructions   Discharge Instructions    Discharge instructions    Complete by:  As directed   Follow up with hospice services as needed.          Current Discharge Medication List    START taking these medications   Details  bisacodyl (DULCOLAX) 10 MG suppository Place 1 suppository (10 mg total) rectally daily as needed for moderate constipation. Qty: 12 suppository, Refills: 0    clopidogrel (PLAVIX) 75 MG tablet Take 1 tablet (75 mg total) by mouth daily.    levETIRAcetam (KEPPRA) 100 MG/ML solution Take 5 mLs (500 mg total) by mouth 2 (two) times daily. Qty: 473 mL, Refills: 12      CONTINUE these medications which have NOT CHANGED   Details  Amino Acids-Protein Hydrolys (FEEDING SUPPLEMENT, PRO-STAT SUGAR FREE 64,) LIQD Take 30 mLs by mouth 3 (three) times daily.    amLODipine (NORVASC) 10 MG tablet Take 10 mg by mouth every morning.     isosorbide mononitrate (IMDUR) 30 MG 24 hr tablet Take 30 mg by mouth daily.    lamoTRIgine (LAMICTAL) 100 MG tablet Take 100 mg by mouth daily.    Nutritional Supplements (ENSURE PO) Take 1 Can by mouth 2 (two) times daily. With lunch and supper    polyethylene glycol (MIRALAX / GLYCOLAX) packet Take 17 g by mouth daily.    valsartan (DIOVAN) 160 MG tablet Take  160 mg by mouth daily.     zonisamide (ZONEGRAN) 100 MG capsule Take 100 mg by mouth 2 (two) times daily.    QUEtiapine (SEROQUEL) 25 MG tablet Take 75 mg by mouth at bedtime.       STOP taking these medications     alfuzosin (UROXATRAL) 10 MG 24 hr tablet      aspirin 81 MG chewable tablet      Calcium Carbonate-Vitamin D (CALCIUM 600 + D PO)      Cholecalciferol (VITAMIN D3) 2000 UNITS TABS      diphenhydrAMINE (BENADRYL) 25 mg capsule      levETIRAcetam  (KEPPRA) 500 MG tablet      loratadine (CLARITIN) 10 MG tablet      Multiple Vitamin (MULTIVITAMIN) tablet      oxyCODONE (OXY IR/ROXICODONE) 5 MG immediate release tablet      trihexyphenidyl (ARTANE) 5 MG tablet      carbamazepine (TEGRETOL) 200 MG tablet        Allergies  Allergen Reactions  . Sulfa Antibiotics Other (See Comments)    Childhood allergy; reaction unknown   Follow-up Information    Follow up with [name],[name], MD.   Specialty:  Family Medicine   Why:  As needed   Contact information:   [address] [place] [address] [phone]        The results of significant diagnostics from this hospitalization (including imaging, microbiology, ancillary and laboratory) are listed below for reference.    Significant Diagnostic Studies: Ct Head Wo Contrast  04/01/2014   CLINICAL DATA:  Increase in confusion and lethargy  EXAM: CT HEAD WITHOUT CONTRAST  TECHNIQUE: Contiguous axial images were obtained from the base of the skull through the vertex without intravenous contrast.  COMPARISON:  October 04, 2012  FINDINGS: There is a ventriculoperitoneal shunt catheter with the tip in the midline between the lateral ventricles, stable. Mild diffuse atrophy remains is stable. There is no intracranial mass, hemorrhage, extra-axial fluid collection, or midline shift. The patient has undergone previous left frontal and temporal craniotomy with the postoperative defects of stable. There is ex vacuo phenomenon involving the anterior horn of the left lateral ventricle with evidence of prior infarct in the left frontal lobe, stable. There is evidence of a prior infarct in the anterior left lentiform nucleus as well as a prior infarct involving the posterior limb of the right internal capsule. There is a prior infarct in the inferior left cerebellum.  In comparison with the previous study, there is a focal infarct in the inferior right cerebellum as well as an infarct in  the medial inferior right occipital lobe. These infarcts may be recent.  Patchy small vessel disease throughout the centra semiovale is overall stable.  The mastoid air cells are clear. There is extensive opacification of most of the sphenoid sinus complex.  IMPRESSION: Suspect recent/acute infarct in the medial right occipital lobe. A second infarct is noted in the posterior right cerebellum which may also be recent/acute. This distribution suggests potential embolic phenomenon in the right posterior circulation. Prior infarcts in the supratentorial region appear stable compared to prior study as does a prior infarct in the inferior left cerebellum. There is a shunt catheter with the tip in the midline between the lateral ventricles, stable. Postoperative defect on the left is stable.  There is new opacification of much of the sphenoid sinus complex.   Electronically Signed   By: Bretta BangWilliam  Woodruff III M.D.  On: 04/01/2014 08:47   Dg Chest Port 1 View  04/01/2014   CLINICAL DATA:  Irregular breathing  EXAM: PORTABLE CHEST - 1 VIEW  COMPARISON:  10/04/2012  FINDINGS: Cardiac shadow is stable. Shunt catheter is again noted along the right chest stable in appearance. No focal infiltrate or sizable effusion is seen. No bony abnormality is noted.  IMPRESSION: No acute abnormality seen.   Electronically Signed   By: Alcide Clever M.D.   On: 04/01/2014 08:01   Dg Swallowing Func-speech Pathology  04/02/2014    Objective Swallowing Evaluation:   Modified Barium Swallow Patient Details  Name: Dereon Corkery Conran MRN: [id number] Date of Birth: September 30, 1934  Today's Date: 04/02/2014 Time: SLP Start Time (ACUTE ONLY): 1345-SLP Stop Time (ACUTE ONLY): 1410 SLP Time Calculation (min) (ACUTE ONLY): 25 min  Past Medical History:  Past Medical History  Diagnosis Date  . Intracerebral hemorrhage   . Bradycardia   . Personal history of other disorders of nervous system and sense organs     seizure   . Syncope and collapse   . Cerebral  aneurysm, nonruptured   . HTN (hypertension)   . Dementia    Past Surgical History:  Past Surgical History  Procedure Laterality Date  . Colonoscopy      x2. one w/hot biopsy. one w/ polypectomy  . Cerebral aneurysm repair      s/p surgery  . Ventriculoperitoneal shunt     HPI:  HPI: 80 y.o. male, who has dementia, ICH, HTN, wheelchair bound admitted  from [facility] with decreased mental status and apnea.Per MD note, pt  normally eats a pureed diet but yesterday successfully ate regular ham for  Easter dinner with the family.Head CT suspect recent/acute infarct in the  medial right occipital lobe. A second infarct is noted in the posterior  right cerebellum which may also be recent/acute. Prior infarcts in the  supratentorial region appear stable compared to prior study as does a  prior infarct in the inferior left cerebellum.CXR No acute abnormality  seen.  No Data Recorded  Assessment / Plan / Recommendation CHL IP CLINICAL IMPRESSIONS 04/02/2014  Dysphagia Diagnosis Moderate oral phase dysphagia;Moderate pharyngeal  phase dysphagia  Clinical impression Pt exhibited moderately delayed manipulation with  holding and delayed transit. Moderate sensorimotor phayrngeal dysphagia  with reduced sensation resulting in delayed swallow initiation, reduced  tongue base retraction leading to max vallecular residue and sensed  aspiration due to poor sensation and motor deficits. PO's not recommended  with consideration of alternative short term nutrition (2-4 days).  Prognosis good for repeat MBS when appropriate.       CHL IP TREATMENT RECOMMENDATION 04/02/2014  Treatment Plan Recommendations Therapy as outlined in treatment plan below      CHL IP DIET RECOMMENDATION 04/02/2014  Diet Recommendations NPO  Liquid Administration via (None)  Medication Administration Via alternative means  Compensations (None)  Postural Changes and/or Swallow Maneuvers (None)     CHL IP OTHER RECOMMENDATIONS 04/02/2014  Recommended Consults  (None)  Oral Care Recommendations (No Data)  Other Recommendations (None)     CHL IP FOLLOW UP RECOMMENDATIONS 04/02/2014  Follow up Recommendations Skilled Nursing facility     [facility] IP FREQUENCY AND DURATION 04/02/2014  Speech Therapy Frequency (ACUTE ONLY) min 2x/week  Treatment Duration 2 weeks     Pertinent Vitals/Pain none    SLP Swallow Goals No flowsheet data found.  No flowsheet data found.    CHL IP REASON FOR REFERRAL 04/02/2014  Reason for  Referral Objectively evaluate swallowing function     CHL IP ORAL PHASE 04/02/2014  Lips (None)  Tongue (None)  Mucous membranes (None)  Nutritional status (None)  Other (None)  Oxygen therapy (None)  Oral Phase Impaired  Oral - Pudding Teaspoon (None)  Oral - Pudding Cup (None)  Oral - Honey Teaspoon Delayed oral transit;Holding of bolus;Reduced  posterior propulsion;Weak lingual manipulation  Oral - Honey Cup Delayed oral transit;Holding of bolus;Reduced posterior  propulsion;Weak lingual manipulation  Oral - Honey Syringe (None)  Oral - Nectar Teaspoon (None)  Oral - Nectar Cup (None)  Oral - Nectar Straw (None)  Oral - Nectar Syringe (None)  Oral - Ice Chips (None)  Oral - Thin Teaspoon (None)  Oral - Thin Cup (None)  Oral - Thin Straw (None)  Oral - Thin Syringe (None)  Oral - Puree Delayed oral transit;Holding of bolus;Reduced posterior  propulsion;Weak lingual manipulation  Oral - Mechanical Soft (None)  Oral - Regular (None)  Oral - Multi-consistency (None)  Oral - Pill (None)  Oral Phase - Comment (None)      CHL IP PHARYNGEAL PHASE 04/02/2014  Pharyngeal Phase Impaired  Pharyngeal - Pudding Teaspoon (None)  Penetration/Aspiration details (pudding teaspoon) (None)  Pharyngeal - Pudding Cup (None)  Penetration/Aspiration details (pudding cup) (None)  Pharyngeal - Honey Teaspoon Penetration/Aspiration during  swallow;Pharyngeal residue - valleculae;Reduced laryngeal  elevation;Reduced airway/laryngeal closure;Reduced tongue base retraction  Penetration/Aspiration  details (honey teaspoon) Material enters airway,  passes BELOW cords and not ejected out despite cough attempt by patient  Pharyngeal - Honey Cup Premature spillage to pyriform sinuses;Pharyngeal  residue - valleculae;Reduced tongue base retraction  Penetration/Aspiration details (honey cup) (None)  Pharyngeal - Honey Syringe (None)  Penetration/Aspiration details (honey syringe) (None)  Pharyngeal - Nectar Teaspoon (None)  Penetration/Aspiration details (nectar teaspoon) (None)  Pharyngeal - Nectar Cup (None)  Penetration/Aspiration details (nectar cup) (None)  Pharyngeal - Nectar Straw (None)  Penetration/Aspiration details (nectar straw) (None)  Pharyngeal - Nectar Syringe (None)  Penetration/Aspiration details (nectar syringe) (None)  Pharyngeal - Ice Chips (None)  Penetration/Aspiration details (ice chips) (None)  Pharyngeal - Thin Teaspoon (None)  Penetration/Aspiration details (thin teaspoon) (None)  Pharyngeal - Thin Cup (None)  Penetration/Aspiration details (thin cup) (None)  Pharyngeal - Thin Straw (None)  Penetration/Aspiration details (thin straw) (None)  Pharyngeal - Thin Syringe (None)  Penetration/Aspiration details (thin syringe') (None)  Pharyngeal - Puree Delayed swallow initiation;Pharyngeal residue -  valleculae;Reduced tongue base retraction;Premature spillage to pyriform  sinuses  Penetration/Aspiration details (puree) (None)  Pharyngeal - Mechanical Soft (None)  Penetration/Aspiration details (mechanical soft) (None)  Pharyngeal - Regular (None)  Penetration/Aspiration details (regular) (None)  Pharyngeal - Multi-consistency (None)  Penetration/Aspiration details (multi-consistency) (None)  Pharyngeal - Pill (None)  Penetration/Aspiration details (pill) (None)  Pharyngeal Comment (None)     CHL IP CERVICAL ESOPHAGEAL PHASE 04/02/2014  Cervical Esophageal Phase WFL  Pudding Teaspoon (None)  Pudding Cup (None)  Honey Teaspoon (None)  Honey Cup (None)  Honey Syringe (None)  Nectar Teaspoon (None)   Nectar Cup (None)  Nectar Straw (None)  Nectar Syringe (None)  Thin Teaspoon (None)  Thin Cup (None)  Thin Straw (None)  Thin Syringe (None)  Cervical Esophageal Comment (None)    No flowsheet data found.         Royce Macadamia 04/02/2014, 3:41 PM   Breck Coons Lonell Face.Ed CCC-SLP Pager [phone]       Microbiology: Recent Results (from the past 240 hour(s))  Urine culture     Status:  None   Collection Time: 04/01/14  8:43 AM  Result Value Ref Range Status   Specimen Description URINE, CATHETERIZED  Final   Special Requests NONE  Final   Colony Count   Final    75,000 COLONIES/ML Performed at [facility]    Culture   Final    VIRIDANS STREPTOCOCCUS Performed at [facility]    Report Status 04/03/2014 FINAL  Final     Labs: Basic Metabolic Panel:  Recent Labs Lab 04/01/14 0748 04/01/14 1357 04/03/14 0521  NA 143  --  146*  K 4.1  --  3.7  CL 111  --  118*  CO2 22  --  19  GLUCOSE 168*  --  99  BUN 36*  --  27*  CREATININE 1.37* 1.10 1.07  CALCIUM 8.9  --  8.8   Liver Function Tests:  Recent Labs Lab 04/01/14 0748  AST 54*  ALT 52  ALKPHOS 66  BILITOT 0.5  PROT 5.5*  ALBUMIN 3.2*   No results for input(s): LIPASE, AMYLASE in the last 168 hours. No results for input(s): AMMONIA in the last 168 hours. CBC:  Recent Labs Lab 04/01/14 0748  WBC 10.4  NEUTROABS 9.3*  HGB 11.6*  HCT 34.2*  MCV 92.4  PLT 207   Cardiac Enzymes: No results for input(s): CKTOTAL, CKMB, CKMBINDEX, TROPONINI in the last 168 hours. BNP: BNP (last 3 results) No results for input(s): BNP in the last 8760 hours.  ProBNP (last 3 results) No results for input(s): PROBNP in the last 8760 hours.  CBG:  Recent Labs Lab 04/03/14 1135 04/03/14 1644 04/04/14 0001 04/04/14 0600 04/04/14 1146  GLUCAP 100* 104* 91 88 95       Signed:  Raeley Gilmore  [facility] 04/04/2014, 12:45 PM

## 2014-04-04 NOTE — Progress Notes (Signed)
Progress Note [facility] Pall[facility] Medicine Team [facility]  Subjective:  -wife and son at bedside, continued conversation regarding GOC, treatment options  -pateint is awake and able to follow commands and verbalize one answer responses  -focus of care is comfort and quality, decision is no PEG, comfort feeds with known risk of aspiration     Objective: Allergies  Allergen Reactions  . Sulfa Antibiotics Other (See Comments)    Childhood allergy; reaction unknown   Scheduled Meds: . aspirin  300 mg Rectal Daily   Or  . aspirin  325 mg Oral Daily  . heparin  5,000 Units Subcutaneous 3 times per [place]  . levETIRAcetam  500 mg Intravenous Q12H  . metoprolol  2.5 mg Intravenous 3 times per [place]  . sodium chloride  500 mL Intravenous Once   Continuous Infusions: . sodium chloride 0.9 % 1,000 mL infusion 75 mL/hr at 04/04/14 0409   PRN Meds:.bisacodyl, haloperidol lactate, hydrALAZINE, LORazepam  BP 180/77 mmHg  Pulse 90  Temp(Src) 98.8 F (37.1 C) (Oral)  Resp 17  Ht 5' 8.4" (1.737 m)  SpO2 99%   PPS:30 % at best  Pain Score:denies   Intake/Output Summary (Last 24 hours) at 04/04/14 1204 Last data filed at 04/04/14 0709  Gross per 24 hour  Intake      0 ml  Output    650 ml  Net   -650 ml       Physical Exam:  General: chronically ill appearing, NAD HEENT:  moist buccal membranes, no exudate Chest:   Decreased in bases CVS: RRR Abdomen:soft NT +BS Ext: contracted without edema   Labs: CBC    Component Value Date/Time   WBC 10.4 04/01/2014 0748   RBC 3.70* 04/01/2014 0748   HGB 11.6* 04/01/2014 0748   HCT 34.2* 04/01/2014 0748   PLT 207 04/01/2014 0748   MCV 92.4 04/01/2014 0748   MCH 31.4 04/01/2014 0748   MCHC 33.9 04/01/2014 0748   RDW 13.2 04/01/2014 0748   LYMPHSABS 0.5* 04/01/2014 0748   MONOABS 0.7 04/01/2014 0748   EOSABS 0.0 04/01/2014 0748   BASOSABS 0.0 04/01/2014 0748    BMET    Component Value Date/Time   NA 146* 04/03/2014  0521   K 3.7 04/03/2014 0521   CL 118* 04/03/2014 0521   CO2 19 04/03/2014 0521   GLUCOSE 99 04/03/2014 0521   BUN 27* 04/03/2014 0521   CREATININE 1.07 04/03/2014 0521   CALCIUM 8.8 04/03/2014 0521   GFRNONAA 65* 04/03/2014 0521   GFRAA 75* 04/03/2014 0521    CMP     Component Value Date/Time   NA 146* 04/03/2014 0521   K 3.7 04/03/2014 0521   CL 118* 04/03/2014 0521   CO2 19 04/03/2014 0521   GLUCOSE 99 04/03/2014 0521   BUN 27* 04/03/2014 0521   CREATININE 1.07 04/03/2014 0521   CALCIUM 8.8 04/03/2014 0521   PROT 5.5* 04/01/2014 0748   ALBUMIN 3.2* 04/01/2014 0748   AST 54* 04/01/2014 0748   ALT 52 04/01/2014 0748   ALKPHOS 66 04/01/2014 0748   BILITOT 0.5 04/01/2014 0748   GFRNONAA 65* 04/03/2014 0521   GFRAA 75* 04/03/2014 0521      Assessment and Plan: 1. Code Status:DNR/DNI 2. Symptom Control:  Dysphagia: comfort feeds with known risk of aspiration      (patitn tolerated a full cup of ice cream without difficulty)    -minimize oral medications, convert to liquid where possible  3. Psycho/Social:Emotional support offered  to patient and his family, all understand the overall poor prognosis but continue to hope for more quality days.  4. Disposition:   Back to SNF with hospice services  Patient Documents Completed or Given: Document Given Completed  Advanced Directives Pkt    MOST X   DNR  X  Gone from My Sight    Hard Choices X     Time In Time Out Total Time Spent with Patient Total Overall Time  1200 1235 35 min 35 min    Greater than 50%  of this time was spent counseling and coordinating care related to the above assessment and plan.  Lorinda Creed NP  Palliative Medicine Team Team Phone # [phone] Pager [phone]  Discussed with Dr Blake Divine 1

## 2014-04-04 NOTE — Progress Notes (Signed)
Speech Language Pathology Treatment: Dysphagia  Patient Details Name: Ian IraniDavid M Sellers MRN: [id number] DOB: 01/16/1934 Today's Date: 04/04/2014 Time: [id number] SLP Time Calculation (min) (ACUTE ONLY): 42 min  Assessment / Plan / Recommendation Clinical Impression  Skilled treatment session focused on addressing dysphagia goals and family education.  SLP facilitated session with Total assist to complete oral care via suctioning.  Patient appeared more lethargic today as compared to yesterday's session and was unable to perform pharyngeal strengthening exercises.  As a result, ice chip trials were attempted.  Patient self-feed 6-7 ice chips in ~20 minutes with Max multimodal cues to initiate a swallow following a significant delay; patient with 1 spontaneous swallow.  Additionally, despite Max multimodal cuing for extra swallow and cough patient inconsistently able to complete. Patient exhibited no coughing or wet vocal quality, but his eyes minimally watered and breathing appeared a little more labored at end of session; however, this also coincided with patient falling asleep and as a result PO trials were ended.  Patient's wife and son were educated on MBS findings, implications of today's presentation and overall swallow recovery which is guarded.  They also asked questions about feeding tubes, placement options and life expectancy which SLP deferred to MD and palliative care team.     HPI HPI: 80 y.o. male, who has dementia, ICH, HTN, wheelchair bound admitted from [facility] with decreased mental status and apnea.Per MD note, pt normally eats a pureed diet but yesterday successfully ate regular ham for Easter dinner with the family.Head CT suspect recent/acute infarct in the medial right occipital lobe. A second infarct is noted in the posterior right cerebellum which may also be recent/acute. Prior infarcts in the supratentorial region appear stable compared to prior study as does a prior infarct in the  inferior left cerebellum.CXR No acute abnormality seen.   Pertinent Vitals Pain Assessment: No/denies pain  SLP Plan  Continue with current plan of care    Recommendations Diet recommendations: NPO Medication Administration: Via alternative means              Oral Care Recommendations: Oral care Q4 per protocol Follow up Recommendations: Skilled Nursing facility;24 hour supervision/assistance;Other (comment) (versus family's wishes for care) Plan: Continue with current plan of care    GO    Charlane FerrettiMelissa Bama Hanselman, M.A., CCC-SLP [phone]  Ian Sellers 04/04/2014, 10:10 AM

## 2014-04-04 NOTE — Clinical Social Work Note (Signed)
CSW met pt and the pt's wife Lazetta at bedside. CSW informed the Mare Ferrari that the pt will be discharge back to [facility] today. CSW contact [facility] at [phone] to schedule transport for the pt. CSW informed Heather at [facility] regarding the pt return. CSW upload the pt's discharge summary to [facility]. Bedside RN can call reported to [place], MSW, [place]

## 2014-05-05 DEATH — deceased

## 2016-09-19 IMAGING — CR DG CHEST 1V PORT
1 series · 1 of 1 positions shown · non-contrast
Comparison: 10/04/2012

CLINICAL DATA: Irregular breathing

EXAM:
PORTABLE CHEST - 1 VIEW

[AP]
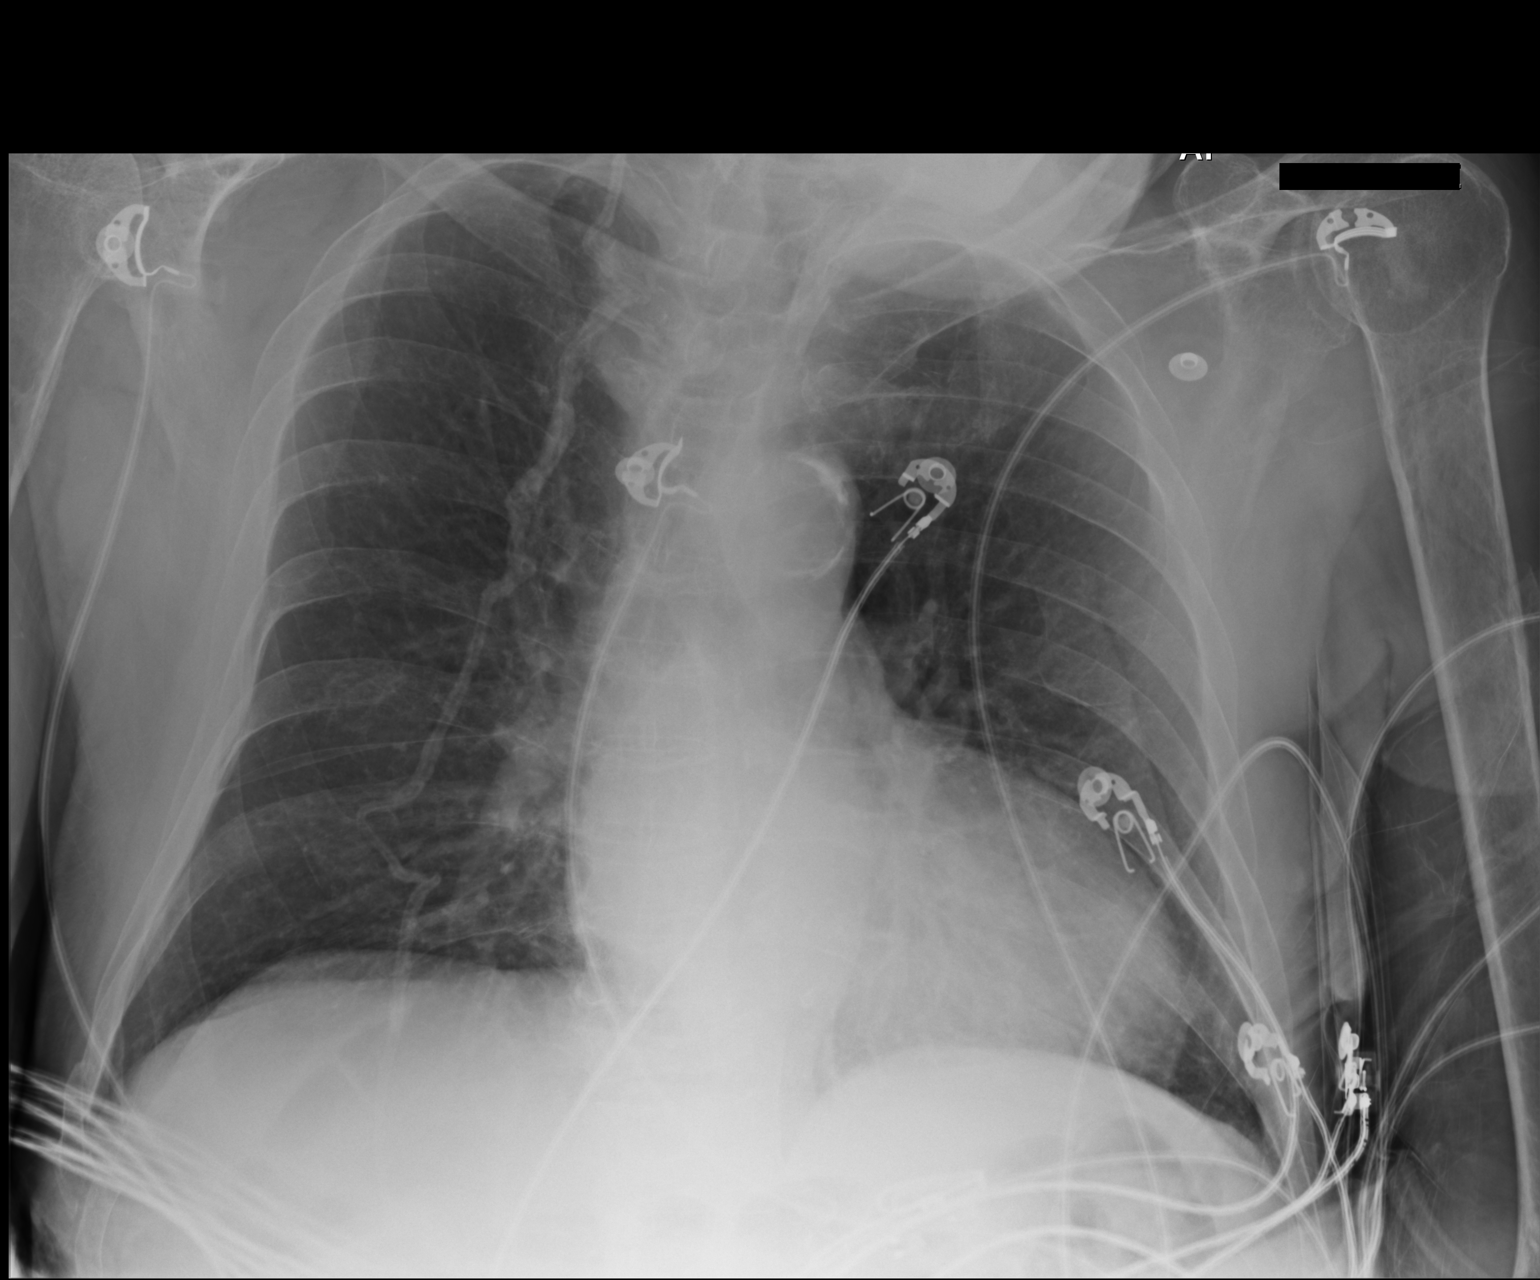

[1 of 1 positions shown; findings below may reference images not displayed]

FINDINGS: Cardiac shadow is stable. Shunt catheter is again noted along the
right chest stable in appearance. No focal infiltrate or sizable
effusion is seen. No bony abnormality is noted.
IMPRESSION: No acute abnormality seen.

## 2016-09-19 IMAGING — CT CT HEAD W/O CM
2 series · 15 of 30 positions shown, 19 images · non-contrast
Comparison: October 04, 2012

CLINICAL DATA: Increase in confusion and lethargy

EXAM:
CT HEAD WITHOUT CONTRAST
TECHNIQUE: Contiguous axial images were obtained from the base of the skull
through the vertex without intravenous contrast.

[Series 201: head w/o, idose (1) · axial · non-contrast · 0.49mm/px · z∈[+48,+173]mm · 13 of 31 slices shown, 17 images]
[im 3/31  brain]
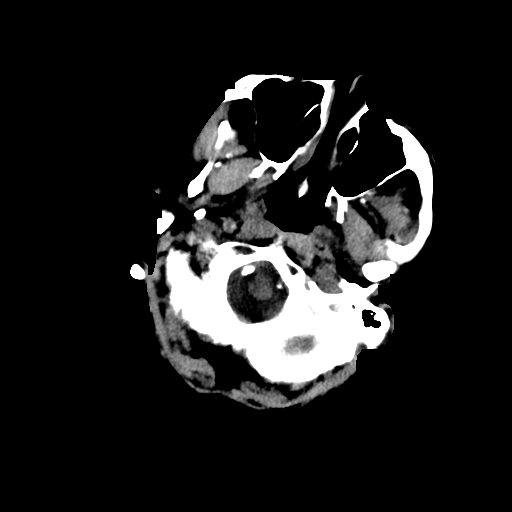
[im 3/31  bone]
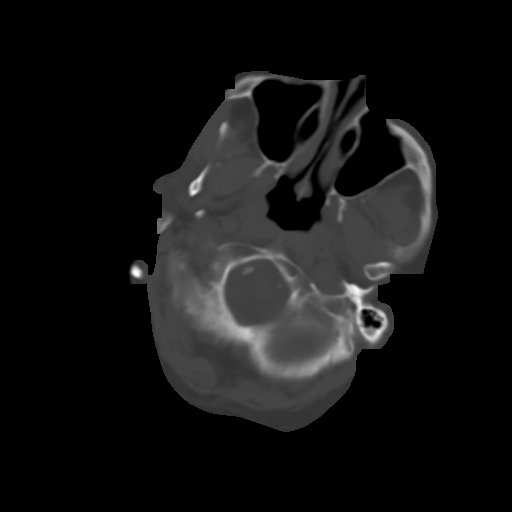
[im 5/31  brain]
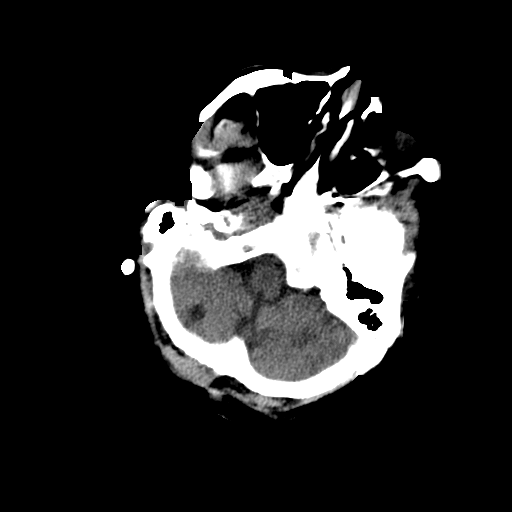
[im 7/31  brain]
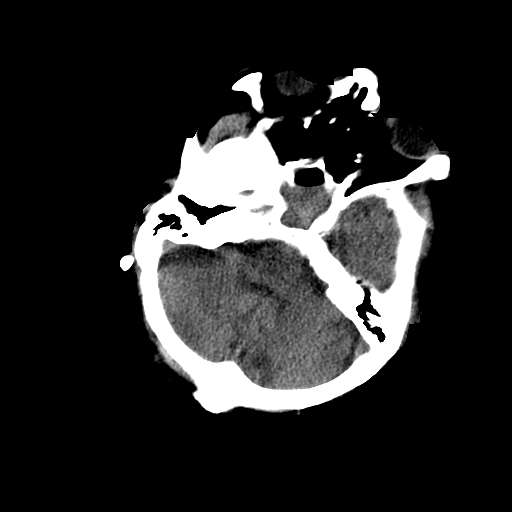
[im 9/31  brain]
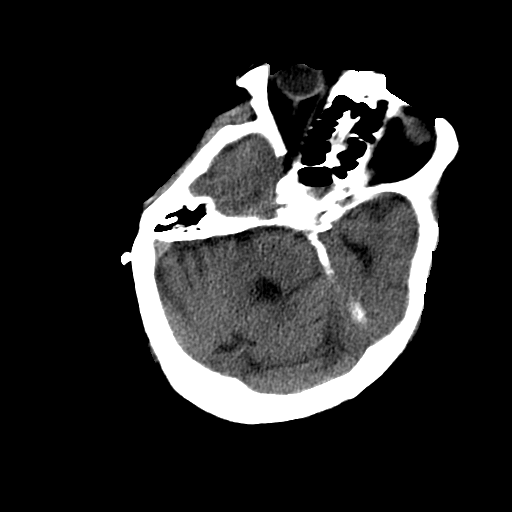
[im 11/31  brain]
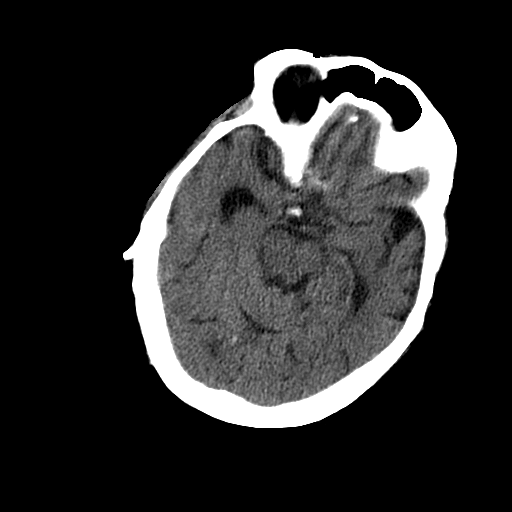
[im 11/31  bone]
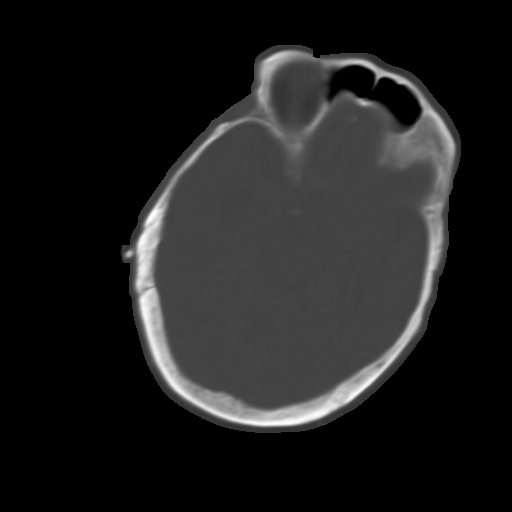
[im 13/31  brain]
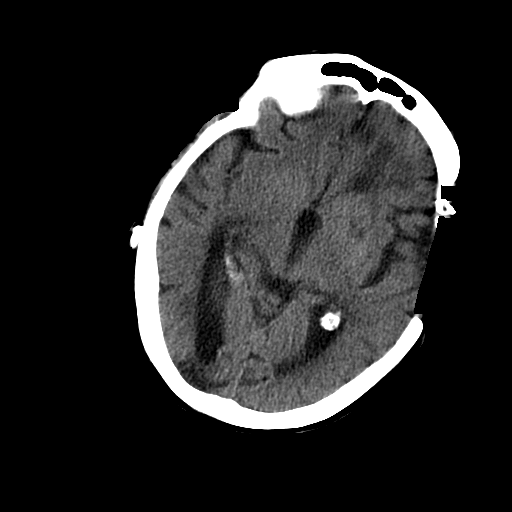
[im 16/31  brain]
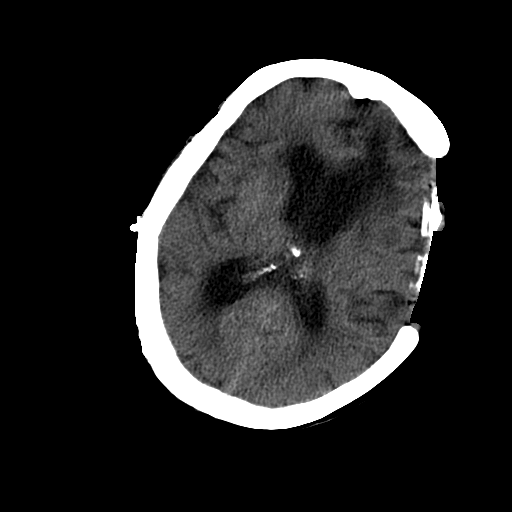
[im 18/31  brain]
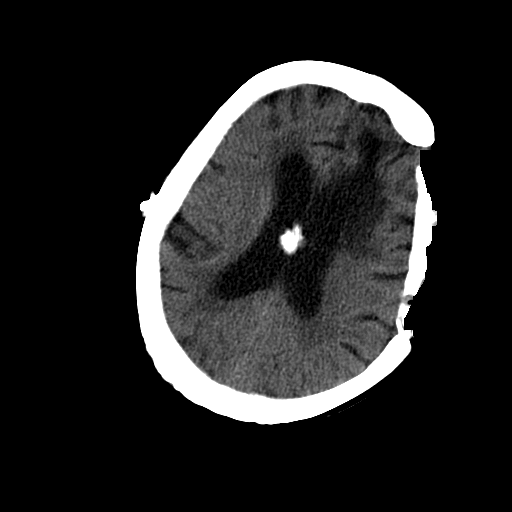
[im 20/31  brain]
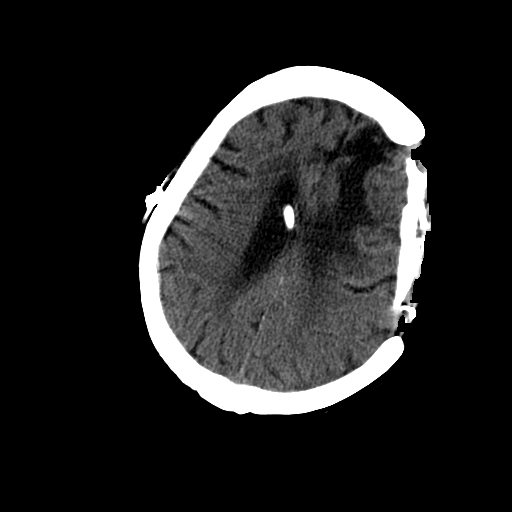
[im 20/31  bone]
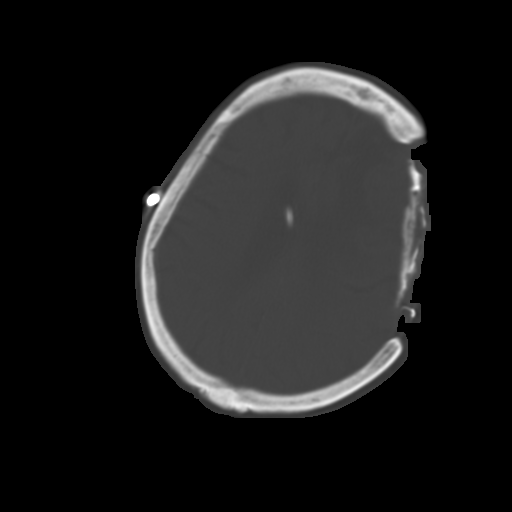
[im 22/31  brain]
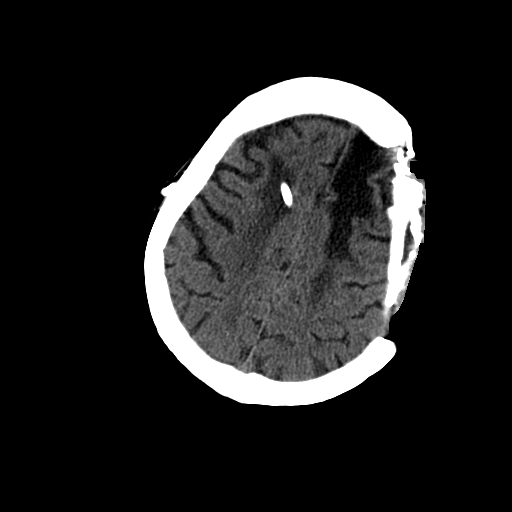
[im 24/31  brain]
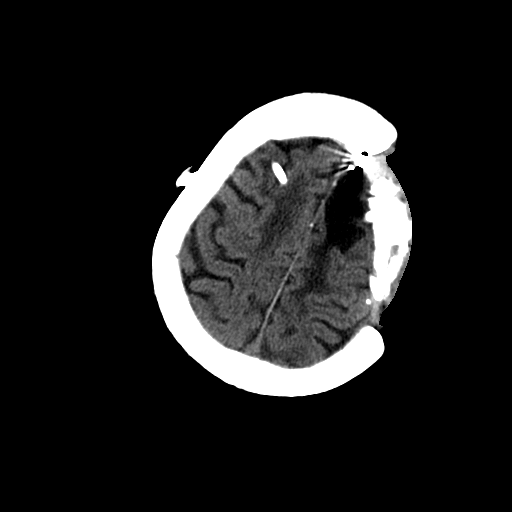
[im 26/31  brain]
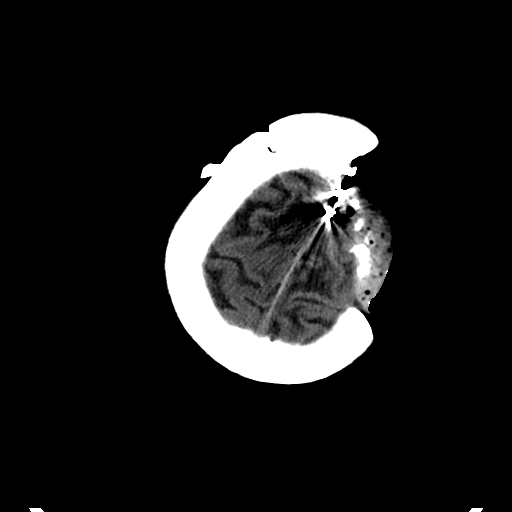
[im 28/31  brain]
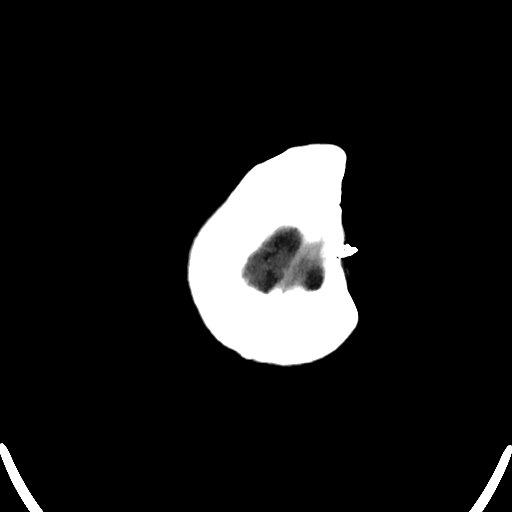
[im 28/31  bone]
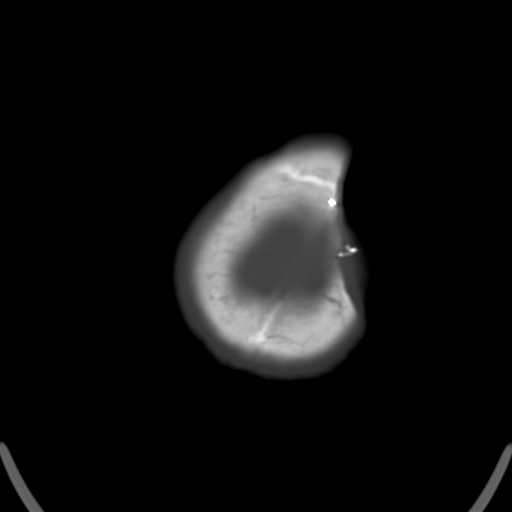

[Series 202: head w/o bone, idose (1) · axial · non-contrast · 0.49mm/px · z∈[+48,+68]mm · 2 of 31 slices shown]
[im 3/31  bone]
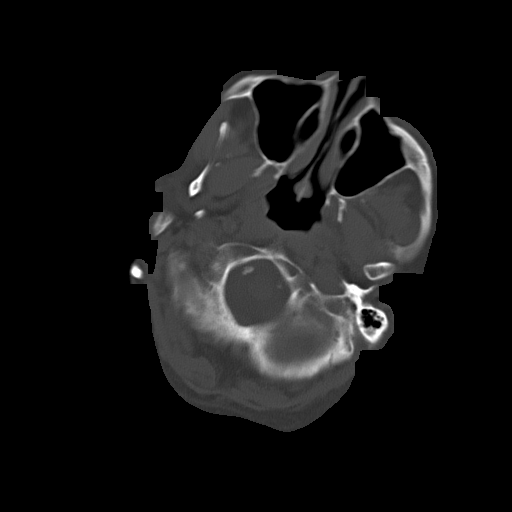
[im 7/31  bone]
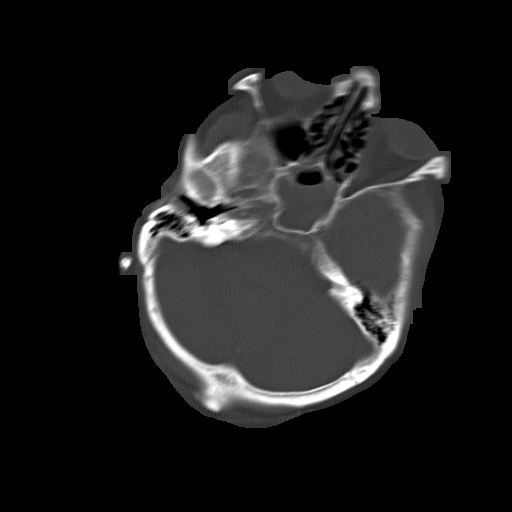

[15 of 30 positions shown; findings below may reference images not displayed]

FINDINGS: There is a ventriculoperitoneal shunt catheter with the tip in the
midline between the lateral ventricles, stable. Mild diffuse atrophy
remains is stable. There is no intracranial mass, hemorrhage,
extra-axial fluid collection, or midline shift. The patient has
undergone previous left frontal and temporal craniotomy with the
postoperative defects of stable. There is ex vacuo phenomenon
involving the anterior horn of the left lateral ventricle with
evidence of prior infarct in the left frontal lobe, stable. There is
evidence of a prior infarct in the anterior left lentiform nucleus
as well as a prior infarct involving the posterior limb of the right
internal capsule. There is a prior infarct in the inferior left
cerebellum.

In comparison with the previous study, there is a focal infarct in
the inferior right cerebellum as well as an infarct in the medial
inferior right occipital lobe. These infarcts may be recent.

Patchy small vessel disease throughout the centra semiovale is
overall stable.

The mastoid air cells are clear. There is extensive opacification of
most of the sphenoid sinus complex.
IMPRESSION: Suspect recent/acute infarct in the medial right occipital lobe. A
second infarct is noted in the posterior right cerebellum which may
also be recent/acute. This distribution suggests potential embolic
phenomenon in the right posterior circulation. Prior infarcts in the
supratentorial region appear stable compared to prior study as does
a prior infarct in the inferior left cerebellum. There is a shunt
catheter with the tip in the midline between the lateral ventricles,
stable. Postoperative defect on the left is stable.

There is new opacification of much of the sphenoid sinus complex.
